# Patient Record
Sex: Male | Born: 1945
Health system: Southern US, Community
[De-identification: ages and names within clinical notes are randomized; demographics above are authoritative.]

## PROBLEM LIST (undated history)

## (undated) DIAGNOSIS — C801 Malignant (primary) neoplasm, unspecified: Secondary | ICD-10-CM

## (undated) DIAGNOSIS — I10 Essential (primary) hypertension: Secondary | ICD-10-CM

---

## 2000-01-09 ENCOUNTER — Other Ambulatory Visit: Admission: RE | Admit: 2000-01-09 | Discharge: 2000-01-09 | Payer: Self-pay | Admitting: Urology

## 2000-01-09 ENCOUNTER — Encounter (INDEPENDENT_AMBULATORY_CARE_PROVIDER_SITE_OTHER): Payer: Self-pay | Admitting: Specialist

## 2007-03-04 ENCOUNTER — Encounter: Admission: RE | Admit: 2007-03-04 | Discharge: 2007-03-04 | Payer: Self-pay | Admitting: Emergency Medicine

## 2007-04-25 ENCOUNTER — Ambulatory Visit: Payer: Self-pay | Admitting: Internal Medicine

## 2007-04-28 LAB — CBC WITH DIFFERENTIAL/PLATELET
BASO%: 0.5 % (ref 0.0–2.0)
Basophils Absolute: 0 10*3/uL (ref 0.0–0.1)
EOS%: 2.1 % (ref 0.0–7.0)
Eosinophils Absolute: 0.1 10*3/uL (ref 0.0–0.5)
HCT: 43.2 % (ref 38.7–49.9)
HGB: 15.1 g/dL (ref 13.0–17.1)
LYMPH%: 46.1 % (ref 14.0–48.0)
MCH: 32.7 pg (ref 28.0–33.4)
MCHC: 35 g/dL (ref 32.0–35.9)
MCV: 93.3 fL (ref 81.6–98.0)
MONO#: 0.4 10*3/uL (ref 0.1–0.9)
MONO%: 9.2 % (ref 0.0–13.0)
NEUT#: 2 10*3/uL (ref 1.5–6.5)
NEUT%: 42.1 % (ref 40.0–75.0)
Platelets: 298 10*3/uL (ref 145–400)
RBC: 4.63 10*6/uL (ref 4.20–5.71)
RDW: 13.1 % (ref 11.2–14.6)
WBC: 4.7 10*3/uL (ref 4.0–10.0)
lymph#: 2.2 10*3/uL (ref 0.9–3.3)

## 2007-04-28 LAB — COMPREHENSIVE METABOLIC PANEL
ALT: 13 U/L (ref 0–53)
AST: 15 U/L (ref 0–37)
Albumin: 4.2 g/dL (ref 3.5–5.2)
Alkaline Phosphatase: 76 U/L (ref 39–117)
BUN: 15 mg/dL (ref 6–23)
CO2: 24 mEq/L (ref 19–32)
Calcium: 9.6 mg/dL (ref 8.4–10.5)
Chloride: 105 mEq/L (ref 96–112)
Creatinine, Ser: 1.24 mg/dL (ref 0.40–1.50)
Glucose, Bld: 86 mg/dL (ref 70–99)
Potassium: 4.6 mEq/L (ref 3.5–5.3)
Sodium: 139 mEq/L (ref 135–145)
Total Bilirubin: 0.7 mg/dL (ref 0.3–1.2)
Total Protein: 7.2 g/dL (ref 6.0–8.3)

## 2007-05-03 ENCOUNTER — Ambulatory Visit (HOSPITAL_COMMUNITY): Admission: RE | Admit: 2007-05-03 | Discharge: 2007-05-03 | Payer: Self-pay | Admitting: Internal Medicine

## 2007-05-12 ENCOUNTER — Encounter (INDEPENDENT_AMBULATORY_CARE_PROVIDER_SITE_OTHER): Payer: Self-pay | Admitting: Orthopedic Surgery

## 2007-05-12 ENCOUNTER — Ambulatory Visit (HOSPITAL_COMMUNITY): Admission: RE | Admit: 2007-05-12 | Discharge: 2007-05-13 | Payer: Self-pay | Admitting: Orthopedic Surgery

## 2007-05-17 ENCOUNTER — Ambulatory Visit: Admission: RE | Admit: 2007-05-17 | Discharge: 2007-06-22 | Payer: Self-pay | Admitting: Radiation Oncology

## 2007-05-17 LAB — CBC WITH DIFFERENTIAL/PLATELET
BASO%: 1.1 % (ref 0.0–2.0)
Basophils Absolute: 0.1 10*3/uL (ref 0.0–0.1)
EOS%: 2.5 % (ref 0.0–7.0)
Eosinophils Absolute: 0.2 10*3/uL (ref 0.0–0.5)
HCT: 39 % (ref 38.7–49.9)
HGB: 14.2 g/dL (ref 13.0–17.1)
LYMPH%: 26.3 % (ref 14.0–48.0)
MCH: 32.6 pg (ref 28.0–33.4)
MCHC: 36.4 g/dL — ABNORMAL HIGH (ref 32.0–35.9)
MCV: 89.6 fL (ref 81.6–98.0)
MONO#: 0.6 10*3/uL (ref 0.1–0.9)
MONO%: 9.8 % (ref 0.0–13.0)
NEUT#: 4 10*3/uL (ref 1.5–6.5)
NEUT%: 60.4 % (ref 40.0–75.0)
Platelets: 305 10*3/uL (ref 145–400)
RBC: 4.36 10*6/uL (ref 4.20–5.71)
RDW: 10.4 % — ABNORMAL LOW (ref 11.2–14.6)
WBC: 6.5 10*3/uL (ref 4.0–10.0)
lymph#: 1.7 10*3/uL (ref 0.9–3.3)

## 2007-05-17 LAB — COMPREHENSIVE METABOLIC PANEL
ALT: 11 U/L (ref 0–53)
AST: 14 U/L (ref 0–37)
Albumin: 4 g/dL (ref 3.5–5.2)
Alkaline Phosphatase: 71 U/L (ref 39–117)
BUN: 20 mg/dL (ref 6–23)
CO2: 20 mEq/L (ref 19–32)
Calcium: 9.2 mg/dL (ref 8.4–10.5)
Chloride: 103 mEq/L (ref 96–112)
Creatinine, Ser: 1.32 mg/dL (ref 0.40–1.50)
Glucose, Bld: 82 mg/dL (ref 70–99)
Potassium: 4.5 mEq/L (ref 3.5–5.3)
Sodium: 138 mEq/L (ref 135–145)
Total Bilirubin: 0.9 mg/dL (ref 0.3–1.2)
Total Protein: 7.1 g/dL (ref 6.0–8.3)

## 2007-05-24 LAB — CBC WITH DIFFERENTIAL/PLATELET
BASO%: 0.9 % (ref 0.0–2.0)
Basophils Absolute: 0 10*3/uL (ref 0.0–0.1)
EOS%: 2.8 % (ref 0.0–7.0)
Eosinophils Absolute: 0.1 10*3/uL (ref 0.0–0.5)
HCT: 38.9 % (ref 38.7–49.9)
HGB: 14.1 g/dL (ref 13.0–17.1)
LYMPH%: 47.9 % (ref 14.0–48.0)
MCH: 31.8 pg (ref 28.0–33.4)
MCHC: 36.2 g/dL — ABNORMAL HIGH (ref 32.0–35.9)
MCV: 87.9 fL (ref 81.6–98.0)
MONO#: 0.2 10*3/uL (ref 0.1–0.9)
MONO%: 7.8 % (ref 0.0–13.0)
NEUT#: 1 10*3/uL — ABNORMAL LOW (ref 1.5–6.5)
NEUT%: 40.6 % (ref 40.0–75.0)
Platelets: 216 10*3/uL (ref 145–400)
RBC: 4.43 10*6/uL (ref 4.20–5.71)
RDW: 9.9 % — ABNORMAL LOW (ref 11.2–14.6)
WBC: 2.5 10*3/uL — ABNORMAL LOW (ref 4.0–10.0)
lymph#: 1.2 10*3/uL (ref 0.9–3.3)

## 2007-05-24 LAB — COMPREHENSIVE METABOLIC PANEL
ALT: 37 U/L (ref 0–53)
AST: 26 U/L (ref 0–37)
Albumin: 4.2 g/dL (ref 3.5–5.2)
Alkaline Phosphatase: 74 U/L (ref 39–117)
BUN: 19 mg/dL (ref 6–23)
CO2: 20 mEq/L (ref 19–32)
Calcium: 8 mg/dL — ABNORMAL LOW (ref 8.4–10.5)
Chloride: 100 mEq/L (ref 96–112)
Creatinine, Ser: 0.94 mg/dL (ref 0.40–1.50)
Glucose, Bld: 96 mg/dL (ref 70–99)
Potassium: 4.2 mEq/L (ref 3.5–5.3)
Sodium: 134 mEq/L — ABNORMAL LOW (ref 135–145)
Total Bilirubin: 0.7 mg/dL (ref 0.3–1.2)
Total Protein: 7.5 g/dL (ref 6.0–8.3)

## 2007-06-07 LAB — COMPREHENSIVE METABOLIC PANEL
ALT: 12 U/L (ref 0–53)
AST: 13 U/L (ref 0–37)
Albumin: 4 g/dL (ref 3.5–5.2)
Alkaline Phosphatase: 101 U/L (ref 39–117)
BUN: 13 mg/dL (ref 6–23)
CO2: 22 mEq/L (ref 19–32)
Calcium: 9.6 mg/dL (ref 8.4–10.5)
Chloride: 106 mEq/L (ref 96–112)
Creatinine, Ser: 0.99 mg/dL (ref 0.40–1.50)
Glucose, Bld: 93 mg/dL (ref 70–99)
Potassium: 4.7 mEq/L (ref 3.5–5.3)
Sodium: 139 mEq/L (ref 135–145)
Total Bilirubin: 0.4 mg/dL (ref 0.3–1.2)
Total Protein: 7.2 g/dL (ref 6.0–8.3)

## 2007-06-07 LAB — CBC WITH DIFFERENTIAL/PLATELET
BASO%: 0.5 % (ref 0.0–2.0)
Basophils Absolute: 0 10*3/uL (ref 0.0–0.1)
EOS%: 0.1 % (ref 0.0–7.0)
Eosinophils Absolute: 0 10*3/uL (ref 0.0–0.5)
HCT: 36.8 % — ABNORMAL LOW (ref 38.7–49.9)
HGB: 12.9 g/dL — ABNORMAL LOW (ref 13.0–17.1)
LYMPH%: 12.2 % — ABNORMAL LOW (ref 14.0–48.0)
MCH: 31.7 pg (ref 28.0–33.4)
MCHC: 35.1 g/dL (ref 32.0–35.9)
MCV: 90.3 fL (ref 81.6–98.0)
MONO#: 0.6 10*3/uL (ref 0.1–0.9)
MONO%: 6.7 % (ref 0.0–13.0)
NEUT#: 7.3 10*3/uL — ABNORMAL HIGH (ref 1.5–6.5)
NEUT%: 80.4 % — ABNORMAL HIGH (ref 40.0–75.0)
Platelets: 587 10*3/uL — ABNORMAL HIGH (ref 145–400)
RBC: 4.08 10*6/uL — ABNORMAL LOW (ref 4.20–5.71)
RDW: 11 % — ABNORMAL LOW (ref 11.2–14.6)
WBC: 9.1 10*3/uL (ref 4.0–10.0)
lymph#: 1.1 10*3/uL (ref 0.9–3.3)

## 2007-06-10 ENCOUNTER — Ambulatory Visit: Payer: Self-pay | Admitting: Internal Medicine

## 2007-06-14 LAB — BASIC METABOLIC PANEL
BUN: 18 mg/dL (ref 6–23)
CO2: 20 mEq/L (ref 19–32)
Calcium: 9.1 mg/dL (ref 8.4–10.5)
Chloride: 103 mEq/L (ref 96–112)
Creatinine, Ser: 1 mg/dL (ref 0.40–1.50)
Glucose, Bld: 111 mg/dL — ABNORMAL HIGH (ref 70–99)
Potassium: 4.3 mEq/L (ref 3.5–5.3)
Sodium: 139 mEq/L (ref 135–145)

## 2007-06-28 LAB — COMPREHENSIVE METABOLIC PANEL
ALT: 16 U/L (ref 0–53)
AST: 19 U/L (ref 0–37)
Albumin: 4 g/dL (ref 3.5–5.2)
Alkaline Phosphatase: 69 U/L (ref 39–117)
BUN: 22 mg/dL (ref 6–23)
CO2: 23 mEq/L (ref 19–32)
Calcium: 9.3 mg/dL (ref 8.4–10.5)
Chloride: 103 mEq/L (ref 96–112)
Creatinine, Ser: 1.06 mg/dL (ref 0.40–1.50)
Glucose, Bld: 84 mg/dL (ref 70–99)
Potassium: 4.3 mEq/L (ref 3.5–5.3)
Sodium: 138 mEq/L (ref 135–145)
Total Bilirubin: 0.6 mg/dL (ref 0.3–1.2)
Total Protein: 7.1 g/dL (ref 6.0–8.3)

## 2007-06-28 LAB — CBC WITH DIFFERENTIAL/PLATELET
BASO%: 0.9 % (ref 0.0–2.0)
Basophils Absolute: 0 10*3/uL (ref 0.0–0.1)
EOS%: 2.2 % (ref 0.0–7.0)
Eosinophils Absolute: 0 10*3/uL (ref 0.0–0.5)
HCT: 29.1 % — ABNORMAL LOW (ref 38.7–49.9)
HGB: 10.5 g/dL — ABNORMAL LOW (ref 13.0–17.1)
LYMPH%: 12.2 % — ABNORMAL LOW (ref 14.0–48.0)
MCH: 31.8 pg (ref 28.0–33.4)
MCHC: 36 g/dL — ABNORMAL HIGH (ref 32.0–35.9)
MCV: 88.4 fL (ref 81.6–98.0)
MONO#: 0.4 10*3/uL (ref 0.1–0.9)
MONO%: 17.2 % — ABNORMAL HIGH (ref 0.0–13.0)
NEUT#: 1.5 10*3/uL (ref 1.5–6.5)
NEUT%: 67.5 % (ref 40.0–75.0)
Platelets: 186 10*3/uL (ref 145–400)
RBC: 3.29 10*6/uL — ABNORMAL LOW (ref 4.20–5.71)
RDW: 12.1 % (ref 11.2–14.6)
WBC: 2.2 10*3/uL — ABNORMAL LOW (ref 4.0–10.0)
lymph#: 0.3 10*3/uL — ABNORMAL LOW (ref 0.9–3.3)

## 2007-07-05 ENCOUNTER — Ambulatory Visit (HOSPITAL_COMMUNITY): Admission: RE | Admit: 2007-07-05 | Discharge: 2007-07-05 | Payer: Self-pay | Admitting: Internal Medicine

## 2007-07-05 LAB — CBC WITH DIFFERENTIAL/PLATELET
BASO%: 1.1 % (ref 0.0–2.0)
Basophils Absolute: 0 10*3/uL (ref 0.0–0.1)
EOS%: 1.8 % (ref 0.0–7.0)
Eosinophils Absolute: 0 10*3/uL (ref 0.0–0.5)
HCT: UNDETERMINED % (ref 38.7–49.9)
HGB: 12.8 g/dL — ABNORMAL LOW (ref 13.0–17.1)
LYMPH%: 14.2 % (ref 14.0–48.0)
MCH: UNDETERMINED pg (ref 28.0–33.4)
MCHC: UNDETERMINED g/dL (ref 32.0–35.9)
MCV: UNDETERMINED fL (ref 81.6–98.0)
MONO#: 0.2 10*3/uL (ref 0.1–0.9)
MONO%: 10.7 % (ref 0.0–13.0)
NEUT#: 1.1 10*3/uL — ABNORMAL LOW (ref 1.5–6.5)
NEUT%: 72.3 % (ref 40.0–75.0)
Platelets: 406 10*3/uL — ABNORMAL HIGH (ref 145–400)
RBC: UNDETERMINED 10*6/uL (ref 4.20–5.71)
RDW: UNDETERMINED % (ref 11.2–14.6)
WBC: 1.5 10*3/uL — ABNORMAL LOW (ref 4.0–10.0)
lymph#: 0.2 10*3/uL — ABNORMAL LOW (ref 0.9–3.3)

## 2007-07-12 LAB — BASIC METABOLIC PANEL
BUN: 14 mg/dL (ref 6–23)
CO2: 24 mEq/L (ref 19–32)
Calcium: 8.4 mg/dL (ref 8.4–10.5)
Chloride: 108 mEq/L (ref 96–112)
Creatinine, Ser: 0.97 mg/dL (ref 0.40–1.50)
Glucose, Bld: 98 mg/dL (ref 70–99)
Potassium: 3.8 mEq/L (ref 3.5–5.3)
Sodium: 141 mEq/L (ref 135–145)

## 2007-07-19 LAB — COMPREHENSIVE METABOLIC PANEL
ALT: 13 U/L (ref 0–53)
AST: 16 U/L (ref 0–37)
Albumin: 3.7 g/dL (ref 3.5–5.2)
Alkaline Phosphatase: 64 U/L (ref 39–117)
BUN: 16 mg/dL (ref 6–23)
CO2: 22 mEq/L (ref 19–32)
Calcium: 8.3 mg/dL — ABNORMAL LOW (ref 8.4–10.5)
Chloride: 105 mEq/L (ref 96–112)
Creatinine, Ser: 0.98 mg/dL (ref 0.40–1.50)
Glucose, Bld: 93 mg/dL (ref 70–99)
Potassium: 4.2 mEq/L (ref 3.5–5.3)
Sodium: 139 mEq/L (ref 135–145)
Total Bilirubin: 0.4 mg/dL (ref 0.3–1.2)
Total Protein: 6.7 g/dL (ref 6.0–8.3)

## 2007-07-19 LAB — CBC WITH DIFFERENTIAL/PLATELET
BASO%: 1.1 % (ref 0.0–2.0)
Basophils Absolute: 0 10*3/uL (ref 0.0–0.1)
EOS%: 0.7 % (ref 0.0–7.0)
Eosinophils Absolute: 0 10*3/uL (ref 0.0–0.5)
HCT: 27.1 % — ABNORMAL LOW (ref 38.7–49.9)
HGB: 9.8 g/dL — ABNORMAL LOW (ref 13.0–17.1)
LYMPH%: 25.3 % (ref 14.0–48.0)
MCH: 33.6 pg — ABNORMAL HIGH (ref 28.0–33.4)
MCHC: 36.1 g/dL — ABNORMAL HIGH (ref 32.0–35.9)
MCV: 93.3 fL (ref 81.6–98.0)
MONO#: 0.7 10*3/uL (ref 0.1–0.9)
MONO%: 16.6 % — ABNORMAL HIGH (ref 0.0–13.0)
NEUT#: 2.3 10*3/uL (ref 1.5–6.5)
NEUT%: 56.3 % (ref 40.0–75.0)
Platelets: 249 10*3/uL (ref 145–400)
RBC: 2.91 10*6/uL — ABNORMAL LOW (ref 4.20–5.71)
RDW: 16.3 % — ABNORMAL HIGH (ref 11.2–14.6)
WBC: 4.1 10*3/uL (ref 4.0–10.0)
lymph#: 1 10*3/uL (ref 0.9–3.3)

## 2007-07-26 ENCOUNTER — Ambulatory Visit: Payer: Self-pay | Admitting: Internal Medicine

## 2007-07-26 LAB — CBC WITH DIFFERENTIAL/PLATELET
BASO%: 1.2 % (ref 0.0–2.0)
Basophils Absolute: 0 10*3/uL (ref 0.0–0.1)
EOS%: 0.8 % (ref 0.0–7.0)
Eosinophils Absolute: 0 10*3/uL (ref 0.0–0.5)
HCT: 26.2 % — ABNORMAL LOW (ref 38.7–49.9)
HGB: 9.2 g/dL — ABNORMAL LOW (ref 13.0–17.1)
LYMPH%: 27.3 % (ref 14.0–48.0)
MCH: 32.7 pg (ref 28.0–33.4)
MCHC: 35.1 g/dL (ref 32.0–35.9)
MCV: 93.1 fL (ref 81.6–98.0)
MONO#: 0.2 10*3/uL (ref 0.1–0.9)
MONO%: 7.8 % (ref 0.0–13.0)
NEUT#: 1.5 10*3/uL (ref 1.5–6.5)
NEUT%: 62.9 % (ref 40.0–75.0)
Platelets: 242 10*3/uL (ref 145–400)
RBC: 2.82 10*6/uL — ABNORMAL LOW (ref 4.20–5.71)
RDW: 14.7 % — ABNORMAL HIGH (ref 11.2–14.6)
WBC: 2.3 10*3/uL — ABNORMAL LOW (ref 4.0–10.0)
lymph#: 0.6 10*3/uL — ABNORMAL LOW (ref 0.9–3.3)

## 2007-08-02 ENCOUNTER — Encounter (HOSPITAL_COMMUNITY): Admission: RE | Admit: 2007-08-02 | Discharge: 2007-10-31 | Payer: Self-pay | Admitting: Internal Medicine

## 2007-08-02 LAB — CBC WITH DIFFERENTIAL/PLATELET
BASO%: 2.4 % — ABNORMAL HIGH (ref 0.0–2.0)
Basophils Absolute: 0 10*3/uL (ref 0.0–0.1)
EOS%: 0.5 % (ref 0.0–7.0)
Eosinophils Absolute: 0 10*3/uL (ref 0.0–0.5)
HCT: 21.6 % — ABNORMAL LOW (ref 38.7–49.9)
HGB: 7.7 g/dL — ABNORMAL LOW (ref 13.0–17.1)
LYMPH%: 35.8 % (ref 14.0–48.0)
MCH: 32.7 pg (ref 28.0–33.4)
MCHC: 35.7 g/dL (ref 32.0–35.9)
MCV: 91.8 fL (ref 81.6–98.0)
MONO#: 0.1 10*3/uL (ref 0.1–0.9)
MONO%: 6.8 % (ref 0.0–13.0)
NEUT#: 0.5 10*3/uL — ABNORMAL LOW (ref 1.5–6.5)
NEUT%: 54.4 % (ref 40.0–75.0)
Platelets: 55 10*3/uL — ABNORMAL LOW (ref 145–400)
RBC: 2.35 10*6/uL — ABNORMAL LOW (ref 4.20–5.71)
RDW: 14 % (ref 11.2–14.6)
WBC: 1 10*3/uL — ABNORMAL LOW (ref 4.0–10.0)
lymph#: 0.3 10*3/uL — ABNORMAL LOW (ref 0.9–3.3)

## 2007-08-02 LAB — TYPE & CROSSMATCH - CHCC

## 2007-08-09 LAB — CBC WITH DIFFERENTIAL/PLATELET
BASO%: 0.1 % (ref 0.0–2.0)
Basophils Absolute: 0 10*3/uL (ref 0.0–0.1)
EOS%: 0.6 % (ref 0.0–7.0)
Eosinophils Absolute: 0 10*3/uL (ref 0.0–0.5)
HCT: 32.3 % — ABNORMAL LOW (ref 38.7–49.9)
HGB: 11.3 g/dL — ABNORMAL LOW (ref 13.0–17.1)
LYMPH%: 27.4 % (ref 14.0–48.0)
MCH: 33.4 pg (ref 28.0–33.4)
MCHC: 35 g/dL (ref 32.0–35.9)
MCV: 95.5 fL (ref 81.6–98.0)
MONO#: 0.4 10*3/uL (ref 0.1–0.9)
MONO%: 17.7 % — ABNORMAL HIGH (ref 0.0–13.0)
NEUT#: 1.1 10*3/uL — ABNORMAL LOW (ref 1.5–6.5)
NEUT%: 54.2 % (ref 40.0–75.0)
Platelets: 256 10*3/uL (ref 145–400)
RBC: 3.38 10*6/uL — ABNORMAL LOW (ref 4.20–5.71)
RDW: 19.9 % — ABNORMAL HIGH (ref 11.2–14.6)
WBC: 2.1 10*3/uL — ABNORMAL LOW (ref 4.0–10.0)
lymph#: 0.6 10*3/uL — ABNORMAL LOW (ref 0.9–3.3)

## 2007-08-09 LAB — COMPREHENSIVE METABOLIC PANEL
ALT: 20 U/L (ref 0–53)
AST: 17 U/L (ref 0–37)
Albumin: 3.8 g/dL (ref 3.5–5.2)
Alkaline Phosphatase: 67 U/L (ref 39–117)
BUN: 14 mg/dL (ref 6–23)
CO2: 24 mEq/L (ref 19–32)
Calcium: 8.7 mg/dL (ref 8.4–10.5)
Chloride: 106 mEq/L (ref 96–112)
Creatinine, Ser: 0.98 mg/dL (ref 0.40–1.50)
Glucose, Bld: 103 mg/dL — ABNORMAL HIGH (ref 70–99)
Potassium: 4.1 mEq/L (ref 3.5–5.3)
Sodium: 140 mEq/L (ref 135–145)
Total Bilirubin: 0.6 mg/dL (ref 0.3–1.2)
Total Protein: 6.9 g/dL (ref 6.0–8.3)

## 2007-08-16 LAB — CBC WITH DIFFERENTIAL/PLATELET
BASO%: 1.6 % (ref 0.0–2.0)
Basophils Absolute: 0.1 10*3/uL (ref 0.0–0.1)
EOS%: 2.2 % (ref 0.0–7.0)
Eosinophils Absolute: 0.1 10*3/uL (ref 0.0–0.5)
HCT: 32.8 % — ABNORMAL LOW (ref 38.7–49.9)
HGB: 11.2 g/dL — ABNORMAL LOW (ref 13.0–17.1)
LYMPH%: 23.7 % (ref 14.0–48.0)
MCH: 34.9 pg — ABNORMAL HIGH (ref 28.0–33.4)
MCHC: 34.2 g/dL (ref 32.0–35.9)
MCV: 102 fL — ABNORMAL HIGH (ref 81.6–98.0)
MONO#: 0.9 10*3/uL (ref 0.1–0.9)
MONO%: 27.2 % — ABNORMAL HIGH (ref 0.0–13.0)
NEUT#: 1.5 10*3/uL (ref 1.5–6.5)
NEUT%: 45.4 % (ref 40.0–75.0)
Platelets: 484 10*3/uL — ABNORMAL HIGH (ref 145–400)
RBC: 3.22 10*6/uL — ABNORMAL LOW (ref 4.20–5.71)
RDW: 18.7 % — ABNORMAL HIGH (ref 11.2–14.6)
WBC: 3.2 10*3/uL — ABNORMAL LOW (ref 4.0–10.0)
lymph#: 0.8 10*3/uL — ABNORMAL LOW (ref 0.9–3.3)

## 2007-08-16 LAB — COMPREHENSIVE METABOLIC PANEL
ALT: 14 U/L (ref 0–53)
AST: 19 U/L (ref 0–37)
Albumin: 3.7 g/dL (ref 3.5–5.2)
Alkaline Phosphatase: 59 U/L (ref 39–117)
BUN: 15 mg/dL (ref 6–23)
CO2: 22 mEq/L (ref 19–32)
Calcium: 8.6 mg/dL (ref 8.4–10.5)
Chloride: 106 mEq/L (ref 96–112)
Creatinine, Ser: 1 mg/dL (ref 0.40–1.50)
Glucose, Bld: 89 mg/dL (ref 70–99)
Potassium: 4 mEq/L (ref 3.5–5.3)
Sodium: 139 mEq/L (ref 135–145)
Total Bilirubin: 0.5 mg/dL (ref 0.3–1.2)
Total Protein: 6.6 g/dL (ref 6.0–8.3)

## 2007-08-23 LAB — CBC WITH DIFFERENTIAL/PLATELET
BASO%: 0.5 % (ref 0.0–2.0)
Basophils Absolute: 0 10*3/uL (ref 0.0–0.1)
EOS%: 0.4 % (ref 0.0–7.0)
Eosinophils Absolute: 0 10*3/uL (ref 0.0–0.5)
HCT: 34.5 % — ABNORMAL LOW (ref 38.7–49.9)
HGB: 11.8 g/dL — ABNORMAL LOW (ref 13.0–17.1)
LYMPH%: 32.6 % (ref 14.0–48.0)
MCH: 33.1 pg (ref 28.0–33.4)
MCHC: 34.4 g/dL (ref 32.0–35.9)
MCV: 96.4 fL (ref 81.6–98.0)
MONO#: 0.2 10*3/uL (ref 0.1–0.9)
MONO%: 9.2 % (ref 0.0–13.0)
NEUT#: 1.3 10*3/uL — ABNORMAL LOW (ref 1.5–6.5)
NEUT%: 57.3 % (ref 40.0–75.0)
Platelets: 386 10*3/uL (ref 145–400)
RBC: 3.58 10*6/uL — ABNORMAL LOW (ref 4.20–5.71)
RDW: 18.5 % — ABNORMAL HIGH (ref 11.2–14.6)
WBC: 2.3 10*3/uL — ABNORMAL LOW (ref 4.0–10.0)
lymph#: 0.7 10*3/uL — ABNORMAL LOW (ref 0.9–3.3)

## 2007-08-24 LAB — HOLD TUBE, BLOOD BANK

## 2007-09-05 ENCOUNTER — Ambulatory Visit: Payer: Self-pay | Admitting: Internal Medicine

## 2007-09-06 LAB — CBC WITH DIFFERENTIAL/PLATELET
BASO%: 0.8 % (ref 0.0–2.0)
Basophils Absolute: 0.1 10*3/uL (ref 0.0–0.1)
EOS%: 0.5 % (ref 0.0–7.0)
Eosinophils Absolute: 0 10*3/uL (ref 0.0–0.5)
HCT: 35.1 % — ABNORMAL LOW (ref 38.7–49.9)
HGB: 11.6 g/dL — ABNORMAL LOW (ref 13.0–17.1)
LYMPH%: 7.1 % — ABNORMAL LOW (ref 14.0–48.0)
MCH: 32.9 pg (ref 28.0–33.4)
MCHC: 32.9 g/dL (ref 32.0–35.9)
MCV: 100 fL — ABNORMAL HIGH (ref 81.6–98.0)
MONO#: 1 10*3/uL — ABNORMAL HIGH (ref 0.1–0.9)
MONO%: 15 % — ABNORMAL HIGH (ref 0.0–13.0)
NEUT#: 4.9 10*3/uL (ref 1.5–6.5)
NEUT%: 76.6 % — ABNORMAL HIGH (ref 40.0–75.0)
Platelets: 417 10*3/uL — ABNORMAL HIGH (ref 145–400)
RBC: 3.52 10*6/uL — ABNORMAL LOW (ref 4.20–5.71)
RDW: 16.7 % — ABNORMAL HIGH (ref 11.2–14.6)
WBC: 6.4 10*3/uL (ref 4.0–10.0)
lymph#: 0.5 10*3/uL — ABNORMAL LOW (ref 0.9–3.3)

## 2007-09-06 LAB — COMPREHENSIVE METABOLIC PANEL
ALT: 20 U/L (ref 0–53)
AST: 24 U/L (ref 0–37)
Albumin: 3.7 g/dL (ref 3.5–5.2)
Alkaline Phosphatase: 62 U/L (ref 39–117)
BUN: 17 mg/dL (ref 6–23)
CO2: 24 mEq/L (ref 19–32)
Calcium: 8.9 mg/dL (ref 8.4–10.5)
Chloride: 103 mEq/L (ref 96–112)
Creatinine, Ser: 0.88 mg/dL (ref 0.40–1.50)
Glucose, Bld: 106 mg/dL — ABNORMAL HIGH (ref 70–99)
Potassium: 4.4 mEq/L (ref 3.5–5.3)
Sodium: 135 mEq/L (ref 135–145)
Total Bilirubin: 0.6 mg/dL (ref 0.3–1.2)
Total Protein: 6.6 g/dL (ref 6.0–8.3)

## 2007-09-07 ENCOUNTER — Ambulatory Visit (HOSPITAL_COMMUNITY): Admission: RE | Admit: 2007-09-07 | Discharge: 2007-09-07 | Payer: Self-pay | Admitting: Internal Medicine

## 2007-09-12 LAB — PROTIME-INR
INR: 1.1 — ABNORMAL LOW (ref 2.00–3.50)
Protime: 13.2 Seconds (ref 10.6–13.4)

## 2007-09-20 ENCOUNTER — Emergency Department (HOSPITAL_COMMUNITY): Admission: EM | Admit: 2007-09-20 | Discharge: 2007-09-20 | Payer: Self-pay | Admitting: Emergency Medicine

## 2007-09-23 ENCOUNTER — Ambulatory Visit (HOSPITAL_COMMUNITY): Admission: RE | Admit: 2007-09-23 | Discharge: 2007-09-23 | Payer: Self-pay | Admitting: Internal Medicine

## 2007-09-27 LAB — COMPREHENSIVE METABOLIC PANEL
ALT: 39 U/L (ref 0–53)
AST: 38 U/L — ABNORMAL HIGH (ref 0–37)
Albumin: 3.8 g/dL (ref 3.5–5.2)
Alkaline Phosphatase: 106 U/L (ref 39–117)
BUN: 21 mg/dL (ref 6–23)
CO2: 26 mEq/L (ref 19–32)
Calcium: 8.8 mg/dL (ref 8.4–10.5)
Chloride: 90 mEq/L — ABNORMAL LOW (ref 96–112)
Creatinine, Ser: 1.08 mg/dL (ref 0.40–1.50)
Glucose, Bld: 112 mg/dL — ABNORMAL HIGH (ref 70–99)
Potassium: 4.5 mEq/L (ref 3.5–5.3)
Sodium: 130 mEq/L — ABNORMAL LOW (ref 135–145)
Total Bilirubin: 0.5 mg/dL (ref 0.3–1.2)
Total Protein: 7.5 g/dL (ref 6.0–8.3)

## 2007-09-27 LAB — CBC WITH DIFFERENTIAL/PLATELET
BASO%: 0.1 % (ref 0.0–2.0)
Basophils Absolute: 0 10*3/uL (ref 0.0–0.1)
EOS%: 0.1 % (ref 0.0–7.0)
Eosinophils Absolute: 0 10*3/uL (ref 0.0–0.5)
HCT: 34.7 % — ABNORMAL LOW (ref 38.7–49.9)
HGB: 12 g/dL — ABNORMAL LOW (ref 13.0–17.1)
LYMPH%: 5.6 % — ABNORMAL LOW (ref 14.0–48.0)
MCH: 32.4 pg (ref 28.0–33.4)
MCHC: 34.6 g/dL (ref 32.0–35.9)
MCV: 93.8 fL (ref 81.6–98.0)
MONO#: 1.5 10*3/uL — ABNORMAL HIGH (ref 0.1–0.9)
MONO%: 16 % — ABNORMAL HIGH (ref 0.0–13.0)
NEUT#: 7.1 10*3/uL — ABNORMAL HIGH (ref 1.5–6.5)
NEUT%: 78.2 % — ABNORMAL HIGH (ref 40.0–75.0)
Platelets: 707 10*3/uL — ABNORMAL HIGH (ref 145–400)
RBC: 3.7 10*6/uL — ABNORMAL LOW (ref 4.20–5.71)
RDW: 17.6 % — ABNORMAL HIGH (ref 11.2–14.6)
WBC: 9.1 10*3/uL (ref 4.0–10.0)
lymph#: 0.5 10*3/uL — ABNORMAL LOW (ref 0.9–3.3)

## 2007-09-27 LAB — PROTIME-INR
INR: 2.7 (ref 2.00–3.50)
Protime: 32.4 Seconds — ABNORMAL HIGH (ref 10.6–13.4)

## 2007-10-04 LAB — PROTIME-INR

## 2007-10-04 LAB — BASIC METABOLIC PANEL
BUN: 11 mg/dL (ref 6–23)
CO2: 25 mEq/L (ref 19–32)
Calcium: 8.7 mg/dL (ref 8.4–10.5)
Chloride: 95 mEq/L — ABNORMAL LOW (ref 96–112)
Creatinine, Ser: 1.03 mg/dL (ref 0.40–1.50)
Glucose, Bld: 107 mg/dL — ABNORMAL HIGH (ref 70–99)
Potassium: 3.8 mEq/L (ref 3.5–5.3)
Sodium: 134 mEq/L — ABNORMAL LOW (ref 135–145)

## 2007-10-04 LAB — PROTHROMBIN TIME
INR: 5.5 (ref 0.0–1.5)
Prothrombin Time: 52 seconds — ABNORMAL HIGH (ref 11.6–15.2)

## 2007-10-07 LAB — PROTIME-INR
INR: 4.3 — ABNORMAL HIGH (ref 2.00–3.50)
Protime: 51.6 Seconds — ABNORMAL HIGH (ref 10.6–13.4)

## 2007-10-10 LAB — PROTIME-INR
INR: 2.4 (ref 2.00–3.50)
Protime: 28.8 Seconds — ABNORMAL HIGH (ref 10.6–13.4)

## 2007-10-13 LAB — PROTIME-INR
INR: 2.2 (ref 2.00–3.50)
Protime: 26.4 Seconds — ABNORMAL HIGH (ref 10.6–13.4)

## 2007-10-18 ENCOUNTER — Ambulatory Visit: Payer: Self-pay | Admitting: Internal Medicine

## 2007-10-20 LAB — PROTIME-INR
INR: 2 (ref 2.00–3.50)
Protime: 24 Seconds — ABNORMAL HIGH (ref 10.6–13.4)

## 2007-10-27 LAB — PROTIME-INR
INR: 1.7 — ABNORMAL LOW (ref 2.00–3.50)
Protime: 20.4 Seconds — ABNORMAL HIGH (ref 10.6–13.4)

## 2007-11-10 LAB — PROTIME-INR
INR: 1.8 — ABNORMAL LOW (ref 2.00–3.50)
Protime: 21.6 Seconds — ABNORMAL HIGH (ref 10.6–13.4)

## 2007-11-17 LAB — PROTIME-INR
INR: 1.6 — ABNORMAL LOW (ref 2.00–3.50)
Protime: 19.2 Seconds — ABNORMAL HIGH (ref 10.6–13.4)

## 2007-12-16 ENCOUNTER — Ambulatory Visit: Payer: Self-pay | Admitting: Internal Medicine

## 2007-12-20 ENCOUNTER — Ambulatory Visit (HOSPITAL_COMMUNITY): Admission: RE | Admit: 2007-12-20 | Discharge: 2007-12-20 | Payer: Self-pay | Admitting: Internal Medicine

## 2007-12-20 LAB — COMPREHENSIVE METABOLIC PANEL
ALT: 19 U/L (ref 0–53)
AST: 27 U/L (ref 0–37)
Albumin: 3.5 g/dL (ref 3.5–5.2)
Alkaline Phosphatase: 60 U/L (ref 39–117)
BUN: 18 mg/dL (ref 6–23)
CO2: 29 mEq/L (ref 19–32)
Calcium: 8.9 mg/dL (ref 8.4–10.5)
Chloride: 104 mEq/L (ref 96–112)
Creatinine, Ser: 1.18 mg/dL (ref 0.40–1.50)
Glucose, Bld: 96 mg/dL (ref 70–99)
Potassium: 3.9 mEq/L (ref 3.5–5.3)
Sodium: 139 mEq/L (ref 135–145)
Total Bilirubin: 0.8 mg/dL (ref 0.3–1.2)
Total Protein: 7 g/dL (ref 6.0–8.3)

## 2007-12-20 LAB — CBC WITH DIFFERENTIAL/PLATELET
BASO%: 0.5 % (ref 0.0–2.0)
Basophils Absolute: 0 10*3/uL (ref 0.0–0.1)
EOS%: 1.3 % (ref 0.0–7.0)
Eosinophils Absolute: 0.1 10*3/uL (ref 0.0–0.5)
HCT: 35.2 % — ABNORMAL LOW (ref 38.7–49.9)
HGB: 12 g/dL — ABNORMAL LOW (ref 13.0–17.1)
LYMPH%: 16.3 % (ref 14.0–48.0)
MCH: 32.4 pg (ref 28.0–33.4)
MCHC: 34 g/dL (ref 32.0–35.9)
MCV: 95.2 fL (ref 81.6–98.0)
MONO#: 0.4 10*3/uL (ref 0.1–0.9)
MONO%: 8.6 % (ref 0.0–13.0)
NEUT#: 3.2 10*3/uL (ref 1.5–6.5)
NEUT%: 73.3 % (ref 40.0–75.0)
Platelets: 320 10*3/uL (ref 145–400)
RBC: 3.69 10*6/uL — ABNORMAL LOW (ref 4.20–5.71)
RDW: 18.7 % — ABNORMAL HIGH (ref 11.2–14.6)
WBC: 4.4 10*3/uL (ref 4.0–10.0)
lymph#: 0.7 10*3/uL — ABNORMAL LOW (ref 0.9–3.3)

## 2007-12-20 LAB — PROTIME-INR
INR: 2 (ref 2.00–3.50)
Protime: 24 Seconds — ABNORMAL HIGH (ref 10.6–13.4)

## 2008-01-10 LAB — PROTIME-INR
INR: 1.7 — ABNORMAL LOW (ref 2.00–3.50)
Protime: 20.4 Seconds — ABNORMAL HIGH (ref 10.6–13.4)

## 2008-01-24 LAB — PROTIME-INR
INR: 2.3 (ref 2.00–3.50)
Protime: 27.6 Seconds — ABNORMAL HIGH (ref 10.6–13.4)

## 2008-02-03 ENCOUNTER — Ambulatory Visit: Payer: Self-pay | Admitting: Internal Medicine

## 2008-02-07 LAB — PROTIME-INR
INR: 2.3 (ref 2.00–3.50)
Protime: 27.6 Seconds — ABNORMAL HIGH (ref 10.6–13.4)

## 2008-02-21 LAB — PROTIME-INR
INR: 1.8 — ABNORMAL LOW (ref 2.00–3.50)
Protime: 21.6 Seconds — ABNORMAL HIGH (ref 10.6–13.4)

## 2008-03-22 ENCOUNTER — Ambulatory Visit: Payer: Self-pay | Admitting: Internal Medicine

## 2008-03-22 LAB — CBC WITH DIFFERENTIAL/PLATELET
BASO%: 0.2 % (ref 0.0–2.0)
Basophils Absolute: 0 10*3/uL (ref 0.0–0.1)
EOS%: 0.5 % (ref 0.0–7.0)
Eosinophils Absolute: 0 10*3/uL (ref 0.0–0.5)
HCT: 40.4 % (ref 38.7–49.9)
HGB: 13.9 g/dL (ref 13.0–17.1)
LYMPH%: 28.7 % (ref 14.0–48.0)
MCH: 33.1 pg (ref 28.0–33.4)
MCHC: 34.4 g/dL (ref 32.0–35.9)
MCV: 96.3 fL (ref 81.6–98.0)
MONO#: 0.3 10*3/uL (ref 0.1–0.9)
MONO%: 9.4 % (ref 0.0–13.0)
NEUT#: 2.3 10*3/uL (ref 1.5–6.5)
NEUT%: 61.2 % (ref 40.0–75.0)
Platelets: 282 10*3/uL (ref 145–400)
RBC: 4.19 10*6/uL — ABNORMAL LOW (ref 4.20–5.71)
RDW: 14.8 % — ABNORMAL HIGH (ref 11.2–14.6)
WBC: 3.7 10*3/uL — ABNORMAL LOW (ref 4.0–10.0)
lymph#: 1.1 10*3/uL (ref 0.9–3.3)

## 2008-03-22 LAB — COMPREHENSIVE METABOLIC PANEL
ALT: 18 U/L (ref 0–53)
AST: 29 U/L (ref 0–37)
Albumin: 3.7 g/dL (ref 3.5–5.2)
Alkaline Phosphatase: 52 U/L (ref 39–117)
BUN: 14 mg/dL (ref 6–23)
CO2: 24 mEq/L (ref 19–32)
Calcium: 8.8 mg/dL (ref 8.4–10.5)
Chloride: 106 mEq/L (ref 96–112)
Creatinine, Ser: 1.48 mg/dL (ref 0.40–1.50)
Glucose, Bld: 98 mg/dL (ref 70–99)
Potassium: 3.4 mEq/L — ABNORMAL LOW (ref 3.5–5.3)
Sodium: 138 mEq/L (ref 135–145)
Total Bilirubin: 0.8 mg/dL (ref 0.3–1.2)
Total Protein: 6.8 g/dL (ref 6.0–8.3)

## 2008-03-22 LAB — PROTIME-INR
INR: 2.2 (ref 2.00–3.50)
Protime: 26.4 Seconds — ABNORMAL HIGH (ref 10.6–13.4)

## 2008-03-23 ENCOUNTER — Ambulatory Visit (HOSPITAL_COMMUNITY): Admission: RE | Admit: 2008-03-23 | Discharge: 2008-03-23 | Payer: Self-pay | Admitting: Internal Medicine

## 2008-08-13 ENCOUNTER — Ambulatory Visit: Payer: Self-pay | Admitting: Internal Medicine

## 2008-11-07 ENCOUNTER — Ambulatory Visit: Payer: Self-pay | Admitting: Internal Medicine

## 2008-11-20 LAB — COMPREHENSIVE METABOLIC PANEL
ALT: 12 U/L (ref 0–53)
AST: 18 U/L (ref 0–37)
Albumin: 4.1 g/dL (ref 3.5–5.2)
Alkaline Phosphatase: 51 U/L (ref 39–117)
BUN: 17 mg/dL (ref 6–23)
CO2: 23 mEq/L (ref 19–32)
Calcium: 9.1 mg/dL (ref 8.4–10.5)
Chloride: 105 mEq/L (ref 96–112)
Creatinine, Ser: 1.29 mg/dL (ref 0.40–1.50)
Glucose, Bld: 85 mg/dL (ref 70–99)
Potassium: 3.9 mEq/L (ref 3.5–5.3)
Sodium: 137 mEq/L (ref 135–145)
Total Bilirubin: 0.6 mg/dL (ref 0.3–1.2)
Total Protein: 6.9 g/dL (ref 6.0–8.3)

## 2008-11-20 LAB — CBC WITH DIFFERENTIAL/PLATELET
BASO%: 0.3 % (ref 0.0–2.0)
Basophils Absolute: 0 10*3/uL (ref 0.0–0.1)
EOS%: 0.3 % (ref 0.0–7.0)
Eosinophils Absolute: 0 10*3/uL (ref 0.0–0.5)
HCT: 39.3 % (ref 38.4–49.9)
HGB: 13.6 g/dL (ref 13.0–17.1)
LYMPH%: 22.8 % (ref 14.0–49.0)
MCH: 35 pg — ABNORMAL HIGH (ref 27.2–33.4)
MCHC: 34.6 g/dL (ref 32.0–36.0)
MCV: 101.1 fL — ABNORMAL HIGH (ref 79.3–98.0)
MONO#: 0.2 10*3/uL (ref 0.1–0.9)
MONO%: 6.2 % (ref 0.0–14.0)
NEUT#: 2.7 10*3/uL (ref 1.5–6.5)
NEUT%: 70.4 % (ref 39.0–75.0)
Platelets: 221 10*3/uL (ref 140–400)
RBC: 3.89 10*6/uL — ABNORMAL LOW (ref 4.20–5.82)
RDW: 14 % (ref 11.0–14.6)
WBC: 3.8 10*3/uL — ABNORMAL LOW (ref 4.0–10.3)
lymph#: 0.9 10*3/uL (ref 0.9–3.3)

## 2009-06-03 ENCOUNTER — Ambulatory Visit: Payer: Self-pay | Admitting: Internal Medicine

## 2009-06-05 ENCOUNTER — Ambulatory Visit (HOSPITAL_COMMUNITY): Admission: RE | Admit: 2009-06-05 | Discharge: 2009-06-05 | Payer: Self-pay | Admitting: Internal Medicine

## 2009-06-05 LAB — CBC WITH DIFFERENTIAL/PLATELET
BASO%: 0.4 % (ref 0.0–2.0)
Basophils Absolute: 0 10*3/uL (ref 0.0–0.1)
EOS%: 1.3 % (ref 0.0–7.0)
Eosinophils Absolute: 0.1 10*3/uL (ref 0.0–0.5)
HCT: 43.2 % (ref 38.4–49.9)
HGB: 14.7 g/dL (ref 13.0–17.1)
LYMPH%: 29.9 % (ref 14.0–49.0)
MCH: 34.4 pg — ABNORMAL HIGH (ref 27.2–33.4)
MCHC: 34 g/dL (ref 32.0–36.0)
MCV: 101.2 fL — ABNORMAL HIGH (ref 79.3–98.0)
MONO#: 0.4 10*3/uL (ref 0.1–0.9)
MONO%: 8.3 % (ref 0.0–14.0)
NEUT#: 3 10*3/uL (ref 1.5–6.5)
NEUT%: 60.1 % (ref 39.0–75.0)
Platelets: 225 10*3/uL (ref 140–400)
RBC: 4.26 10*6/uL (ref 4.20–5.82)
RDW: 14.2 % (ref 11.0–14.6)
WBC: 5 10*3/uL (ref 4.0–10.3)
lymph#: 1.5 10*3/uL (ref 0.9–3.3)

## 2009-06-05 LAB — COMPREHENSIVE METABOLIC PANEL
ALT: 17 U/L (ref 0–53)
AST: 22 U/L (ref 0–37)
Albumin: 4.1 g/dL (ref 3.5–5.2)
Alkaline Phosphatase: 56 U/L (ref 39–117)
BUN: 18 mg/dL (ref 6–23)
CO2: 28 mEq/L (ref 19–32)
Calcium: 9.3 mg/dL (ref 8.4–10.5)
Chloride: 101 mEq/L (ref 96–112)
Creatinine, Ser: 1.44 mg/dL (ref 0.40–1.50)
Glucose, Bld: 98 mg/dL (ref 70–99)
Potassium: 4.3 mEq/L (ref 3.5–5.3)
Sodium: 136 mEq/L (ref 135–145)
Total Bilirubin: 0.8 mg/dL (ref 0.3–1.2)
Total Protein: 7.4 g/dL (ref 6.0–8.3)

## 2009-08-04 IMAGING — CT CT ABDOMEN W/ CM
2 of 5 series · 15 of 46 positions shown, 17 images · IV contrast (agent unspecified)
Comparison: 07/05/2007

CLINICAL DATA: Lung cancer, ongoing chemotherapy

CT CHEST WITH CONTRAST,CT ABDOMEN WITH CONTRAST,CT PELVIS WITH
CONTRAST
CT ABDOMEN WITH CONTRAST
CT PELVIS WITH CONTRAST
TECHNIQUE: Multidetector CT imaging of the chest was performed
following the standard protocol during bolus administration of
intravenous contrast.,Technique:  Multidetector CT imaging of the
abdomen was performed following the standard protocol during b
Contrast: 100 ml 1mnipaque-D88 IV

[Series 2: cap 5.0 b40f st · axial · 0.88mm/px · z∈[-662,-67]mm · 12 of 135 slices shown, 14 images]
[im 8/135  soft-tissue]
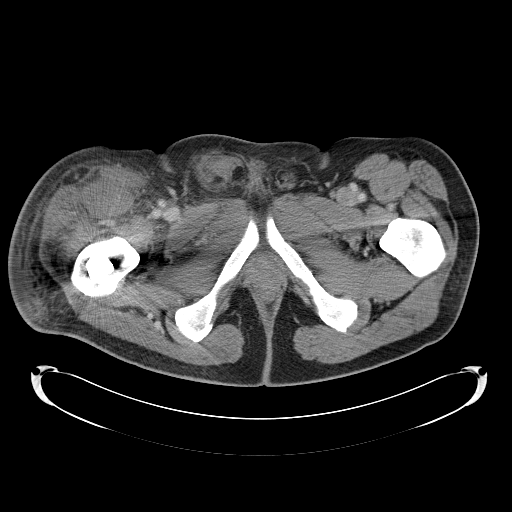
[im 8/135  bone]
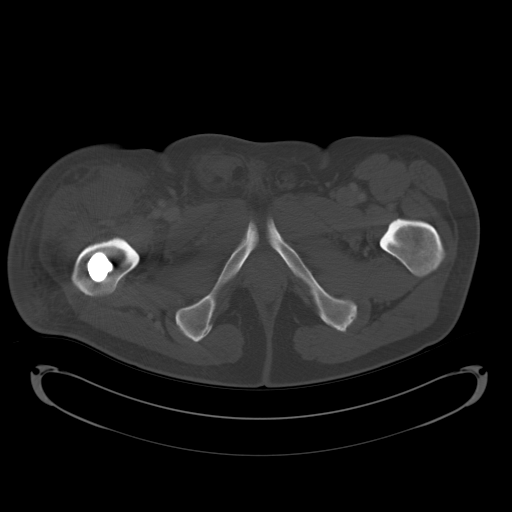
[im 23/135  soft-tissue]
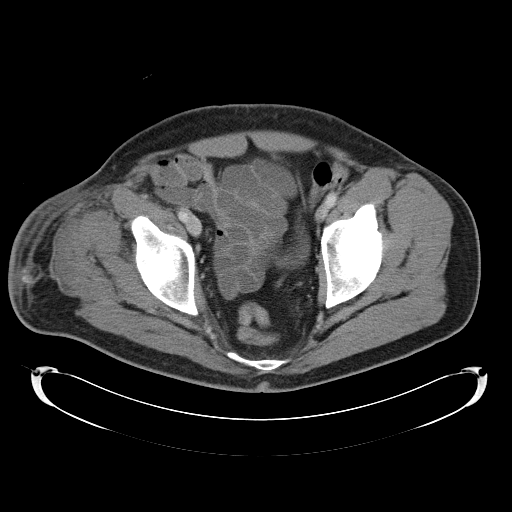
[im 30/135  soft-tissue]
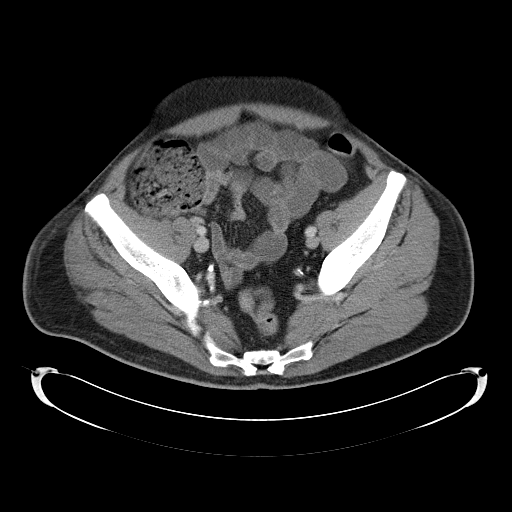
[im 38/135  soft-tissue]
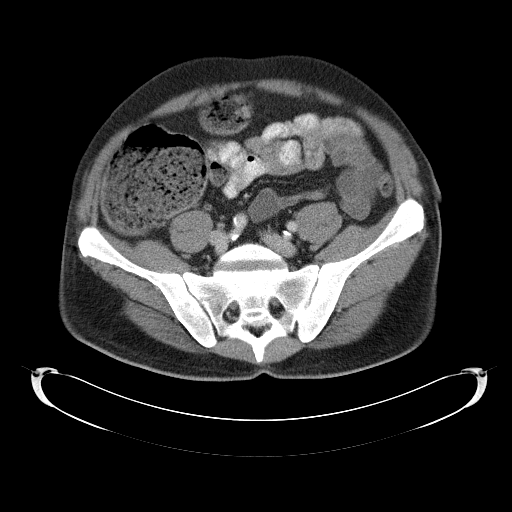
[im 53/135  soft-tissue]
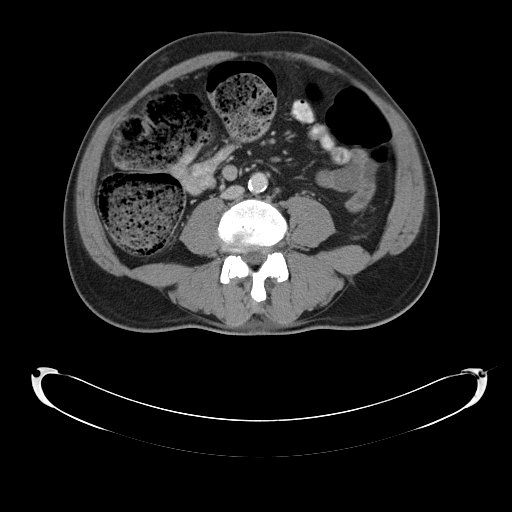
[im 60/135  soft-tissue]
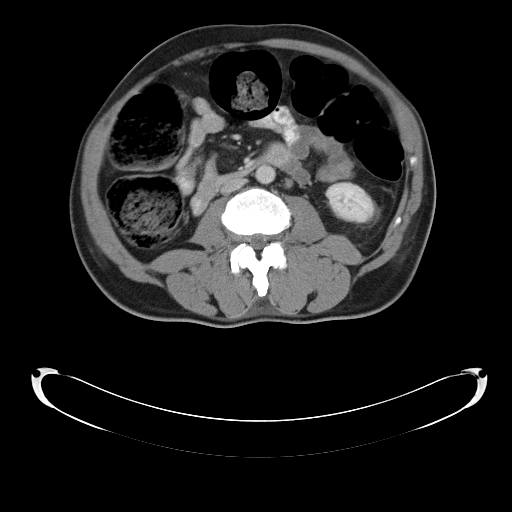
[im 75/135  soft-tissue]
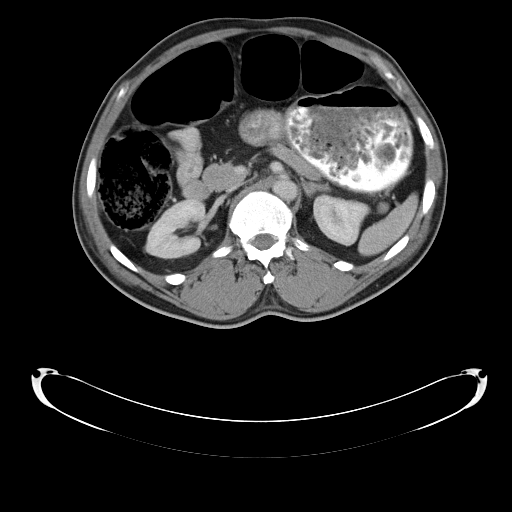
[im 82/135  soft-tissue]
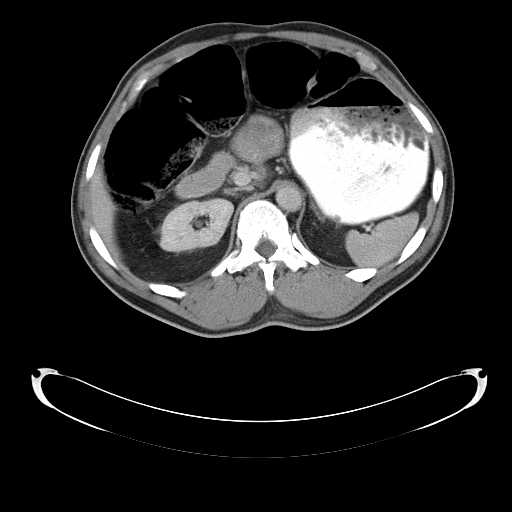
[im 97/135  soft-tissue]
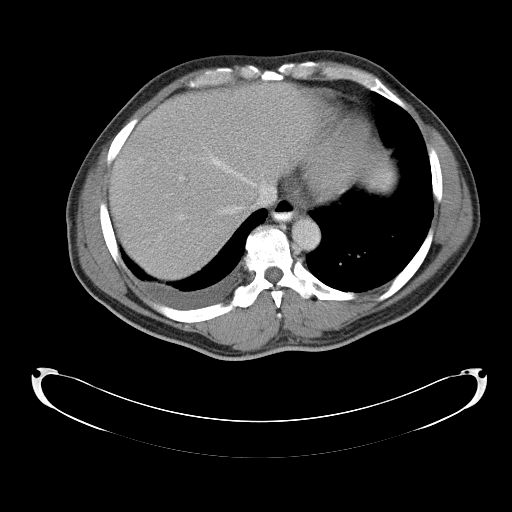
[im 97/135  bone]
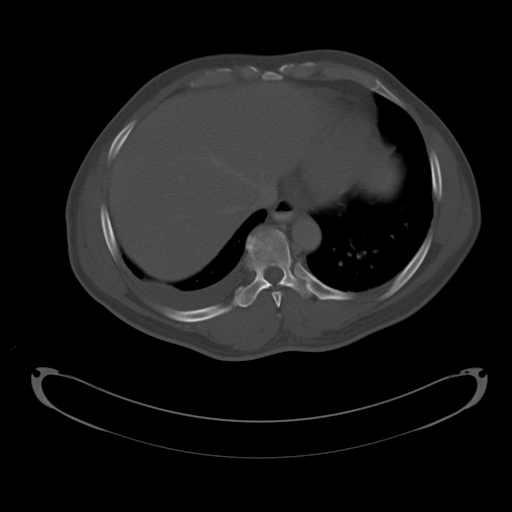
[im 105/135  soft-tissue]
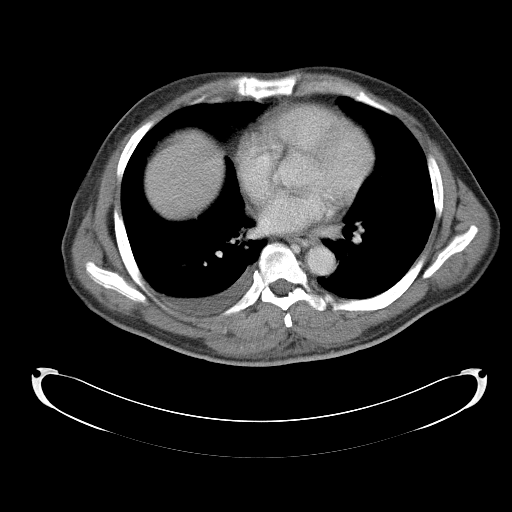
[im 112/135  soft-tissue]
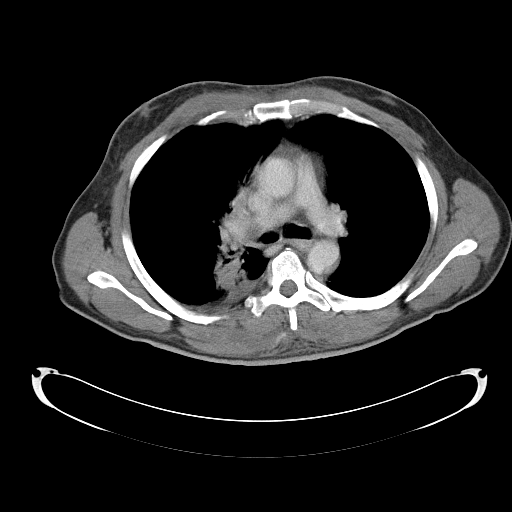
[im 127/135  soft-tissue]
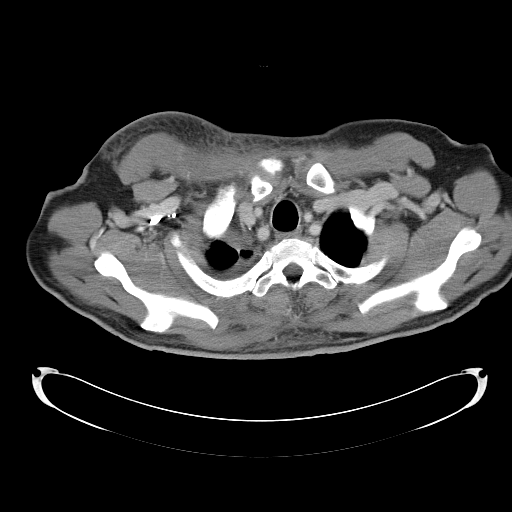

[Series 602: coronal images · coronal · 1.35mm/px · 3 of 93 slices shown]
[im 31/93  soft-tissue]
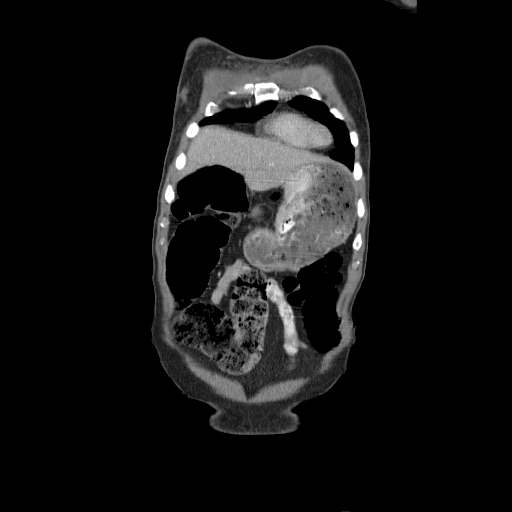
[im 41/93  soft-tissue]
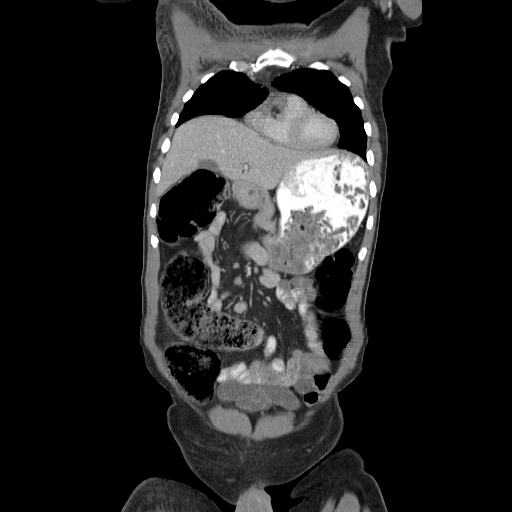
[im 52/93  soft-tissue]
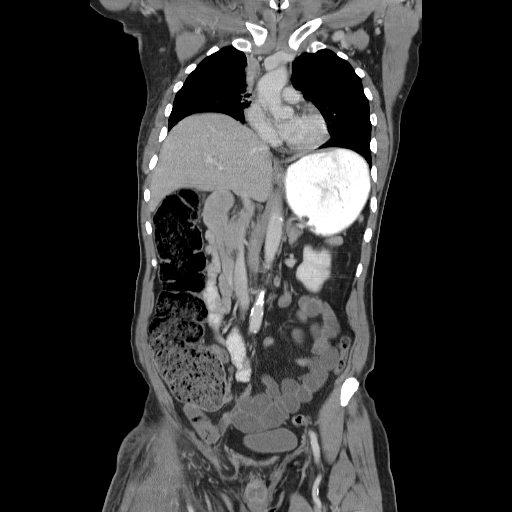

[15 of 46 positions shown; findings below may reference images not displayed]

FINDINGS: Right suprahilar mass again noted with complete collapse
of the right upper lobe.  It is very difficult to measure the right
suprahilar mass given the adjacent collapsed lung, best estimate is
3.8 x 2.4 cm, compared 3.1 x 2.4 cm previously.  Visually, this
does not appear significantly changed, and again the slight
difference may be simply related to inability to define well the
borders of the mass.

There is a new small right pleural effusions.  Rounded opacity
noted in the posterior right lower lobe with air bronchograms.
Mild haziness noted in the paramediastinal lungs bilaterally,
possibly related to radiation.  Otherwise left lung clear.

No enlarging lymph nodes.  Heart is normal size.  Small pericardial
effusion, new since prior study.  There is stranding in the
subcutaneous soft tissues of the anterior chest wall, predominately
overlying the sternum and right pectus muscles.  If this is within
the radiation field, this can be related to radiation.  Otherwise,
this may represent cellulitis.  Recommend clinical correlation.

No acute bony abnormality.
IMPRESSION: Right suprahilar mass visually appears stable with complete
collapse of the adjacent right upper lobe.  Very difficult to
measure due to surrounding collapsed lung.

There is new rounded airspace disease in the posterior medial right
lower lobe.  Air bronchograms noted.  I favor this represents an
infectious or inflammatory process.  Recommend follow up to assure
resolution.

New small right pleural effusion and pericardial effusion.

Subcutaneous anterior chest wall edema, possibly related to
radiation or cellulitis.  Recommend clinical correlation.

CT ABDOMEN
FINDINGS: Colon is dilated, with a large amount of stool
particularly in the cecum and ascending colon.  Stomach dilated
with debris/food material.  No focal lesion in the liver, spleen,
pancreas, adrenals, or kidneys.  Small bowel grossly unremarkable.
No free fluid, free air, or adenopathy.  Atherosclerotic
calcifications in the aorta without aneurysm.

Sclerotic focus again seen within the L1 vertebral body, stable
IMPRESSION: Stable sclerotic focus in the left side of the L1 vertebral body.

Large amount stool throughout the colon which is distended with
stool in the cecum and ascending colon.

No acute findings or new evidence of metastases.

CT PELVIS
FINDINGS: Right inguinal hernia present, containing fat,
unchanged.  A large amount stool noted in the cecum.  The rectum
and sigmoid colon are decompressed.  Pelvic small bowel grossly
unremarkable.  No free fluid, free air, or adenopathy.  Prostate is
enlarged.

Postoperative changes noted in the region of the right hip.  There
is a sclerotic lesion in the right iliac bone adjacent to the right
SI joint, stable.
IMPRESSION: Stable sclerotic lesion in the right iliac bone.

A large amount stool in the cecum.

## 2009-11-26 ENCOUNTER — Ambulatory Visit: Payer: Self-pay | Admitting: Internal Medicine

## 2009-11-28 ENCOUNTER — Ambulatory Visit (HOSPITAL_COMMUNITY): Admission: RE | Admit: 2009-11-28 | Discharge: 2009-11-28 | Payer: Self-pay | Admitting: Internal Medicine

## 2009-11-28 LAB — COMPREHENSIVE METABOLIC PANEL
ALT: 17 U/L (ref 0–53)
AST: 23 U/L (ref 0–37)
Albumin: 3.8 g/dL (ref 3.5–5.2)
Alkaline Phosphatase: 55 U/L (ref 39–117)
BUN: 11 mg/dL (ref 6–23)
CO2: 29 mEq/L (ref 19–32)
Calcium: 9.3 mg/dL (ref 8.4–10.5)
Chloride: 104 mEq/L (ref 96–112)
Creatinine, Ser: 1.3 mg/dL (ref 0.40–1.50)
Glucose, Bld: 104 mg/dL — ABNORMAL HIGH (ref 70–99)
Potassium: 4.8 mEq/L (ref 3.5–5.3)
Sodium: 138 mEq/L (ref 135–145)
Total Bilirubin: 0.8 mg/dL (ref 0.3–1.2)
Total Protein: 6.9 g/dL (ref 6.0–8.3)

## 2009-11-28 LAB — CBC WITH DIFFERENTIAL/PLATELET
BASO%: 0.2 % (ref 0.0–2.0)
Basophils Absolute: 0 10*3/uL (ref 0.0–0.1)
EOS%: 1.3 % (ref 0.0–7.0)
Eosinophils Absolute: 0.1 10*3/uL (ref 0.0–0.5)
HCT: 41 % (ref 38.4–49.9)
HGB: 14.1 g/dL (ref 13.0–17.1)
LYMPH%: 30.9 % (ref 14.0–49.0)
MCH: 33.5 pg — ABNORMAL HIGH (ref 27.2–33.4)
MCHC: 34.3 g/dL (ref 32.0–36.0)
MCV: 97.6 fL (ref 79.3–98.0)
MONO#: 0.4 10*3/uL (ref 0.1–0.9)
MONO%: 11 % (ref 0.0–14.0)
NEUT#: 2.2 10*3/uL (ref 1.5–6.5)
NEUT%: 56.6 % (ref 39.0–75.0)
Platelets: 222 10*3/uL (ref 140–400)
RBC: 4.21 10*6/uL (ref 4.20–5.82)
RDW: 13.6 % (ref 11.0–14.6)
WBC: 3.9 10*3/uL — ABNORMAL LOW (ref 4.0–10.3)
lymph#: 1.2 10*3/uL (ref 0.9–3.3)

## 2010-05-29 ENCOUNTER — Ambulatory Visit: Payer: Self-pay | Admitting: Internal Medicine

## 2010-06-02 ENCOUNTER — Ambulatory Visit (HOSPITAL_COMMUNITY)
Admission: RE | Admit: 2010-06-02 | Discharge: 2010-06-02 | Payer: Self-pay | Source: Home / Self Care | Attending: Internal Medicine | Admitting: Internal Medicine

## 2010-06-02 LAB — CBC WITH DIFFERENTIAL/PLATELET
BASO%: 0.2 % (ref 0.0–2.0)
Basophils Absolute: 0 10*3/uL (ref 0.0–0.1)
EOS%: 0.9 % (ref 0.0–7.0)
Eosinophils Absolute: 0 10*3/uL (ref 0.0–0.5)
HCT: 43.7 % (ref 38.4–49.9)
HGB: 14.9 g/dL (ref 13.0–17.1)
LYMPH%: 27.6 % (ref 14.0–49.0)
MCH: 33 pg (ref 27.2–33.4)
MCHC: 34.1 g/dL (ref 32.0–36.0)
MCV: 96.9 fL (ref 79.3–98.0)
MONO#: 0.4 10*3/uL (ref 0.1–0.9)
MONO%: 8.4 % (ref 0.0–14.0)
NEUT#: 2.8 10*3/uL (ref 1.5–6.5)
NEUT%: 62.9 % (ref 39.0–75.0)
Platelets: 244 10*3/uL (ref 140–400)
RBC: 4.51 10*6/uL (ref 4.20–5.82)
RDW: 14 % (ref 11.0–14.6)
WBC: 4.5 10*3/uL (ref 4.0–10.3)
lymph#: 1.2 10*3/uL (ref 0.9–3.3)

## 2010-06-02 LAB — COMPREHENSIVE METABOLIC PANEL
ALT: 19 U/L (ref 0–53)
AST: 24 U/L (ref 0–37)
Albumin: 4 g/dL (ref 3.5–5.2)
Alkaline Phosphatase: 60 U/L (ref 39–117)
BUN: 15 mg/dL (ref 6–23)
CO2: 25 mEq/L (ref 19–32)
Calcium: 9.3 mg/dL (ref 8.4–10.5)
Chloride: 105 mEq/L (ref 96–112)
Creatinine, Ser: 1.39 mg/dL (ref 0.40–1.50)
Glucose, Bld: 103 mg/dL — ABNORMAL HIGH (ref 70–99)
Potassium: 4.1 mEq/L (ref 3.5–5.3)
Sodium: 139 mEq/L (ref 135–145)
Total Bilirubin: 1 mg/dL (ref 0.3–1.2)
Total Protein: 7.6 g/dL (ref 6.0–8.3)

## 2010-07-11 ENCOUNTER — Other Ambulatory Visit: Payer: Self-pay | Admitting: Internal Medicine

## 2010-07-11 DIAGNOSIS — C3490 Malignant neoplasm of unspecified part of unspecified bronchus or lung: Secondary | ICD-10-CM

## 2010-07-13 ENCOUNTER — Encounter: Payer: Self-pay | Admitting: Internal Medicine

## 2010-07-14 ENCOUNTER — Encounter: Payer: Self-pay | Admitting: Emergency Medicine

## 2010-09-08 LAB — POCT I-STAT, CHEM 8
BUN: 16 mg/dL (ref 6–23)
Calcium, Ion: 1.15 mmol/L (ref 1.12–1.32)
Chloride: 104 mEq/L (ref 96–112)
Creatinine, Ser: 1.2 mg/dL (ref 0.4–1.5)
Glucose, Bld: 102 mg/dL — ABNORMAL HIGH (ref 70–99)
HCT: 43 % (ref 39.0–52.0)
Hemoglobin: 14.6 g/dL (ref 13.0–17.0)
Potassium: 5 mEq/L (ref 3.5–5.1)
Sodium: 137 mEq/L (ref 135–145)
TCO2: 26 mmol/L (ref 0–100)

## 2010-11-04 NOTE — Op Note (Signed)
NAME:  Tommy Johnson, Tommy Johnson NO.:  [id number]   MEDICAL RECORD NO.:  [id number]          PATIENT TYPE:  INP   LOCATION:  5007                         FACILITY:  [facility]   PHYSICIAN:  Nadara Mustard, MD     DATE OF BIRTH:  1945-09-19   DATE OF PROCEDURE:  05/12/2007  DATE OF DISCHARGE:                               OPERATIVE REPORT   PREOPERATIVE DIAGNOSIS:  Pending pathologic right femoral neck fracture.   POSTOPERATIVE DIAGNOSIS:  Pending pathologic right femoral neck  fracture.   PROCEDURE:  Intramedullary nail fixation of the right femur with a Recon  nail, 420 mm long, 11 mm in diameter Synthes with a 105 mm spiral blade  proximally.   SURGEON:  Nadara Mustard, M.D.   ANESTHESIA:  General.   ESTIMATED BLOOD LOSS:  Minimal.   ANTIBIOTICS:  1 gram of Kefzol.   DRAINS:  None.   COMPLICATIONS:  None.   SPECIMENS:  Bone specimen sent to pathology.   DISPOSITION:  To the PACU in stable condition.   INDICATIONS FOR PROCEDURE:  The patient is a 65 year old gentleman with  metastatic CA with a CT scan and a PET scan that showed a large  metastatic lesion in the femoral neck.  Due to the size and location of  the lesion, the patient is at risk for pathologic fracture and presents  at this time for surgical intervention.  The risks and benefits were  discussed including infection, neurovascular injury, persistent pain,  and need for additional surgery.  The patient states he understands and  wished to proceed at this time.   DESCRIPTION OF PROCEDURE:  The patient was brought to OR room 5 and  underwent a general anesthetic.  After an adequate level of anesthesia  was obtained, the patient was placed supine on the [facility] fracture  table.  The right lower extremity was placed in the boot traction.  The  left lower extremity was placed in the dorsal lithotomy position and the  hip was prepped using DuraPrep and draped into a sterile field with the  shower curtain.   A skin incision was made just proximal to the greater  trochanter.  A guidewire was inserted into the greater trochanter.  C-  arm fluoroscopy verified reduction.  The 17.5 drill was used.  The  metastatic bone was extremely sclerotic and this was difficult to drill  through.  C-arm fluoroscopy again verified the alignment of the  drilling.  The guidewire was then inserted. This measured 420 mm in  length and this was then sequentially reamed to 13 mm for the 11 mm  nail.  The nail was inserted and using the external alignment jig, the  guidewire was inset and was inserted center/center in the AP radiographs  and in the lateral radiographs, this was just inferior to the middle of  the femoral head.  This was then measured to be a 105 and this was  overdrilled due to the sclerotic bone and the spiral blade was placed.  The spiral blade was locked. The alignment jig was removed.  C-arm  fluoroscopy  verified reduction in both AP and lateral planes.  The  wounds were irrigated with normal saline.  The subcu was closed using 2-  0 Vicryl and the skin was closed using approximate staples, and then a  Mepilex dressing was applied.  The patient was extubated and taken to  the PACU in stable condition.      Nadara Mustard, MD  Electronically Signed     MVD/MEDQ  D:  05/12/2007  T:  05/12/2007  Job:  [id number]

## 2010-12-01 ENCOUNTER — Other Ambulatory Visit: Payer: Self-pay | Admitting: Internal Medicine

## 2010-12-01 ENCOUNTER — Ambulatory Visit (HOSPITAL_COMMUNITY)
Admission: RE | Admit: 2010-12-01 | Discharge: 2010-12-01 | Disposition: A | Discharge status: Home / Self Care | Payer: Medicare Other | Source: Ambulatory Visit | Attending: Internal Medicine | Admitting: Internal Medicine

## 2010-12-01 ENCOUNTER — Encounter (HOSPITAL_BASED_OUTPATIENT_CLINIC_OR_DEPARTMENT_OTHER): Payer: Medicare Other | Admitting: Internal Medicine

## 2010-12-01 ENCOUNTER — Encounter (HOSPITAL_COMMUNITY): Payer: Self-pay

## 2010-12-01 DIAGNOSIS — M47814 Spondylosis without myelopathy or radiculopathy, thoracic region: Secondary | ICD-10-CM | POA: Insufficient documentation

## 2010-12-01 DIAGNOSIS — C3490 Malignant neoplasm of unspecified part of unspecified bronchus or lung: Secondary | ICD-10-CM | POA: Insufficient documentation

## 2010-12-01 DIAGNOSIS — N200 Calculus of kidney: Secondary | ICD-10-CM | POA: Insufficient documentation

## 2010-12-01 DIAGNOSIS — N400 Enlarged prostate without lower urinary tract symptoms: Secondary | ICD-10-CM | POA: Insufficient documentation

## 2010-12-01 DIAGNOSIS — K4020 Bilateral inguinal hernia, without obstruction or gangrene, not specified as recurrent: Secondary | ICD-10-CM | POA: Insufficient documentation

## 2010-12-01 HISTORY — DX: Malignant (primary) neoplasm, unspecified: C80.1

## 2010-12-01 LAB — CMP (CANCER CENTER ONLY)
ALT(SGPT): 22 U/L (ref 10–47)
AST: 27 U/L (ref 11–38)
Albumin: 3.4 g/dL (ref 3.3–5.5)
Alkaline Phosphatase: 74 U/L (ref 26–84)
BUN, Bld: 17 mg/dL (ref 7–22)
CO2: 26 mEq/L (ref 18–33)
Calcium: 8.9 mg/dL (ref 8.0–10.3)
Chloride: 100 mEq/L (ref 98–108)
Creat: 1.3 mg/dl — ABNORMAL HIGH (ref 0.6–1.2)
Glucose, Bld: 99 mg/dL (ref 73–118)
Potassium: 4.5 mEq/L (ref 3.3–4.7)
Sodium: 141 mEq/L (ref 128–145)
Total Bilirubin: 0.7 mg/dl (ref 0.20–1.60)
Total Protein: 7.1 g/dL (ref 6.4–8.1)

## 2010-12-01 LAB — CBC WITH DIFFERENTIAL/PLATELET
BASO%: 0.3 % (ref 0.0–2.0)
Basophils Absolute: 0 10*3/uL (ref 0.0–0.1)
EOS%: 1.7 % (ref 0.0–7.0)
Eosinophils Absolute: 0.1 10*3/uL (ref 0.0–0.5)
HCT: 42.3 % (ref 38.4–49.9)
HGB: 14.4 g/dL (ref 13.0–17.1)
LYMPH%: 31.6 % (ref 14.0–49.0)
MCH: 32.8 pg (ref 27.2–33.4)
MCHC: 34.1 g/dL (ref 32.0–36.0)
MCV: 96.1 fL (ref 79.3–98.0)
MONO#: 0.4 10*3/uL (ref 0.1–0.9)
MONO%: 9.7 % (ref 0.0–14.0)
NEUT#: 2.4 10*3/uL (ref 1.5–6.5)
NEUT%: 56.7 % (ref 39.0–75.0)
Platelets: 233 10*3/uL (ref 140–400)
RBC: 4.4 10*6/uL (ref 4.20–5.82)
RDW: 13.7 % (ref 11.0–14.6)
WBC: 4.3 10*3/uL (ref 4.0–10.3)
lymph#: 1.4 10*3/uL (ref 0.9–3.3)

## 2010-12-01 MED ORDER — IOHEXOL 300 MG/ML  SOLN
100.0000 mL | Freq: Once | INTRAMUSCULAR | Status: AC | PRN
  Administered 2010-12-01: 100 mL via INTRAVENOUS

## 2010-12-04 ENCOUNTER — Encounter (HOSPITAL_BASED_OUTPATIENT_CLINIC_OR_DEPARTMENT_OTHER): Payer: Medicare Other | Admitting: Internal Medicine

## 2010-12-04 ENCOUNTER — Other Ambulatory Visit: Payer: Self-pay | Admitting: Internal Medicine

## 2010-12-04 DIAGNOSIS — Z85118 Personal history of other malignant neoplasm of bronchus and lung: Secondary | ICD-10-CM

## 2010-12-04 DIAGNOSIS — C3490 Malignant neoplasm of unspecified part of unspecified bronchus or lung: Secondary | ICD-10-CM

## 2011-03-13 LAB — CROSSMATCH
ABO/RH(D): B POS
Antibody Screen: NEGATIVE

## 2011-03-13 LAB — ABO/RH: ABO/RH(D): B POS

## 2011-03-31 LAB — CBC
HCT: 44.7
Hemoglobin: 15.3
MCHC: 34.1
MCV: 94.1
Platelets: 309
RBC: 4.76
RDW: 13.3
WBC: 5

## 2011-03-31 LAB — BASIC METABOLIC PANEL
BUN: 14
CO2: 28
Calcium: 9.3
Chloride: 101
Creatinine, Ser: 1.19
GFR calc Af Amer: >60
GFR calc non Af Amer: >60
Glucose, Bld: 99
Potassium: 4.4
Sodium: 137

## 2011-05-28 ENCOUNTER — Other Ambulatory Visit: Payer: Medicare Other | Admitting: Lab

## 2011-05-28 ENCOUNTER — Inpatient Hospital Stay (HOSPITAL_COMMUNITY): Admission: RE | Admit: 2011-05-28 | Payer: Medicare Other | Source: Ambulatory Visit

## 2011-06-02 ENCOUNTER — Ambulatory Visit: Payer: Medicare Other | Admitting: Internal Medicine

## 2011-06-02 ENCOUNTER — Telehealth: Payer: Self-pay | Admitting: Internal Medicine

## 2011-06-02 ENCOUNTER — Encounter: Payer: Self-pay | Admitting: Internal Medicine

## 2011-06-02 NOTE — Telephone Encounter (Signed)
Called pt, cell number disconnected, called home number, left message regarding CT on 12/28 lab draw first then CT. Pt will see MD on 12/31.Also informed pt to get oral contrast before CT

## 2011-06-02 NOTE — Telephone Encounter (Signed)
Returned pt call who was returning [place] call form yesterday. Pt did not get CT scan on 05/28/11 so I told him not to come in for appt today. I sent Rose a request to r/s ct and f/u and call pt.

## 2011-06-19 ENCOUNTER — Other Ambulatory Visit (HOSPITAL_BASED_OUTPATIENT_CLINIC_OR_DEPARTMENT_OTHER): Payer: Medicare Other | Admitting: Lab

## 2011-06-19 ENCOUNTER — Ambulatory Visit (HOSPITAL_COMMUNITY)
Admission: RE | Admit: 2011-06-19 | Discharge: 2011-06-19 | Disposition: A | Discharge status: Home / Self Care | Payer: Medicare Other | Source: Ambulatory Visit | Attending: Internal Medicine | Admitting: Internal Medicine

## 2011-06-19 ENCOUNTER — Other Ambulatory Visit: Payer: Self-pay | Admitting: Internal Medicine

## 2011-06-19 DIAGNOSIS — K4020 Bilateral inguinal hernia, without obstruction or gangrene, not specified as recurrent: Secondary | ICD-10-CM | POA: Insufficient documentation

## 2011-06-19 DIAGNOSIS — M47817 Spondylosis without myelopathy or radiculopathy, lumbosacral region: Secondary | ICD-10-CM | POA: Insufficient documentation

## 2011-06-19 DIAGNOSIS — C3490 Malignant neoplasm of unspecified part of unspecified bronchus or lung: Secondary | ICD-10-CM

## 2011-06-19 DIAGNOSIS — I700 Atherosclerosis of aorta: Secondary | ICD-10-CM | POA: Insufficient documentation

## 2011-06-19 DIAGNOSIS — M47814 Spondylosis without myelopathy or radiculopathy, thoracic region: Secondary | ICD-10-CM | POA: Insufficient documentation

## 2011-06-19 DIAGNOSIS — I2510 Atherosclerotic heart disease of native coronary artery without angina pectoris: Secondary | ICD-10-CM | POA: Insufficient documentation

## 2011-06-19 DIAGNOSIS — M948X9 Other specified disorders of cartilage, unspecified sites: Secondary | ICD-10-CM | POA: Insufficient documentation

## 2011-06-19 DIAGNOSIS — N400 Enlarged prostate without lower urinary tract symptoms: Secondary | ICD-10-CM | POA: Insufficient documentation

## 2011-06-19 LAB — CMP (CANCER CENTER ONLY)
ALT(SGPT): 21 U/L (ref 10–47)
AST: 21 U/L (ref 11–38)
Albumin: 3.8 g/dL (ref 3.3–5.5)
Alkaline Phosphatase: 67 U/L (ref 26–84)
BUN, Bld: 17 mg/dL (ref 7–22)
CO2: 30 mEq/L (ref 18–33)
Calcium: 9.6 mg/dL (ref 8.0–10.3)
Chloride: 98 mEq/L (ref 98–108)
Creat: 1.2 mg/dl (ref 0.6–1.2)
Glucose, Bld: 100 mg/dL (ref 73–118)
Potassium: 4.3 mEq/L (ref 3.3–4.7)
Sodium: 145 mEq/L (ref 128–145)
Total Bilirubin: 1.2 mg/dl (ref 0.20–1.60)
Total Protein: 8.2 g/dL — ABNORMAL HIGH (ref 6.4–8.1)

## 2011-06-19 LAB — CBC WITH DIFFERENTIAL/PLATELET
BASO%: 0.3 % (ref 0.0–2.0)
Basophils Absolute: 0 10*3/uL (ref 0.0–0.1)
EOS%: 1.7 % (ref 0.0–7.0)
Eosinophils Absolute: 0.1 10*3/uL (ref 0.0–0.5)
HCT: 46.6 % (ref 38.4–49.9)
HGB: 16 g/dL (ref 13.0–17.1)
LYMPH%: 37.8 % (ref 14.0–49.0)
MCH: 33.1 pg (ref 27.2–33.4)
MCHC: 34.3 g/dL (ref 32.0–36.0)
MCV: 96.4 fL (ref 79.3–98.0)
MONO#: 0.5 10*3/uL (ref 0.1–0.9)
MONO%: 9.6 % (ref 0.0–14.0)
NEUT#: 2.5 10*3/uL (ref 1.5–6.5)
NEUT%: 50.6 % (ref 39.0–75.0)
Platelets: 241 10*3/uL (ref 140–400)
RBC: 4.84 10*6/uL (ref 4.20–5.82)
RDW: 13.6 % (ref 11.0–14.6)
WBC: 5 10*3/uL (ref 4.0–10.3)
lymph#: 1.9 10*3/uL (ref 0.9–3.3)

## 2011-06-19 MED ORDER — IOHEXOL 300 MG/ML  SOLN
100.0000 mL | Freq: Once | INTRAMUSCULAR | Status: AC | PRN
  Administered 2011-06-19: 100 mL via INTRAVENOUS

## 2011-06-22 ENCOUNTER — Ambulatory Visit (HOSPITAL_BASED_OUTPATIENT_CLINIC_OR_DEPARTMENT_OTHER): Payer: Medicare Other | Admitting: Internal Medicine

## 2011-06-22 VITALS — BP 153/88 | HR 94 | Temp 97.1°F | Wt 215.9 lb

## 2011-06-22 DIAGNOSIS — Z85118 Personal history of other malignant neoplasm of bronchus and lung: Secondary | ICD-10-CM

## 2011-06-22 DIAGNOSIS — C3490 Malignant neoplasm of unspecified part of unspecified bronchus or lung: Secondary | ICD-10-CM

## 2011-06-22 DIAGNOSIS — Z9221 Personal history of antineoplastic chemotherapy: Secondary | ICD-10-CM

## 2011-06-22 NOTE — Progress Notes (Signed)
[facility] OFFICE PROGRESS NOTE  DIAGNOSIS: Metastatic non-small cell lung cancer, squamous cell carcinoma diagnosed in September 2008.  PRIOR THERAPY:  1. Status post right femoral fixation for impending fracture performed on May 12, 2007. 2. Status post palliative radiotherapy to the chest and right hip area under the care of Dr. Kathrynn Running between May 31, 2007, through June 20, 2007. 3. Status post 6 cycles of systemic chemotherapy with carboplatin and gemcitabine.  Last dose was given September 13, 2007. 4. Status post 6 months' treatment with Coumadin for deep venous thrombosis of the right lower extremity that was diagnosed in March 2009.  CURRENT THERAPY: Observation.  INTERVAL HISTORY: Tommy Johnson 65 y.o. male returns to the clinic today for six-month followup visit. The patient has no complaints today. He denied having any significant chest pain or shortness of breath., no cough or hemoptysis. He has no weight loss or night sweats. He has repeat CT scan of the chest, abdomen and pelvis performed recently and he is here today for evaluation and discussion of his scan results.  MEDICAL HISTORY: Past Medical History  Diagnosis Date  . Cancer     lung ca    ALLERGIES:   has no known allergies.  MEDICATIONS:  Current Outpatient Prescriptions  Medication Sig Dispense Refill  . b complex vitamins tablet Take 1 tablet by mouth daily.        . enalapril (VASOTEC) 20 MG tablet Take 20 mg by mouth daily.        . fish oil-omega-3 fatty acids 1000 MG capsule Take 2 g by mouth daily.        Marland Kitchen levothyroxine (SYNTHROID, LEVOTHROID) 75 MCG tablet Take 75 mcg by mouth daily.        . Multiple Vitamin (MULTIVITAMIN) tablet Take 1 tablet by mouth daily.          REVIEW OF SYSTEMS:  A comprehensive review of systems was negative.   PHYSICAL EXAMINATION: General appearance: alert, cooperative and no distress Head: Normocephalic, without obvious abnormality,  atraumatic Neck: no adenopathy Lymph nodes: Cervical, supraclavicular, and axillary nodes normal. Resp: clear to auscultation bilaterally Cardio: regular rate and rhythm, S1, S2 normal, no murmur, click, rub or gallop GI: soft, non-tender; bowel sounds normal; no masses,  no organomegaly Extremities: extremities normal, atraumatic, no cyanosis or edema Neurologic: Alert and oriented X 3, normal strength and tone. Normal symmetric reflexes. Normal coordination and gait  ECOG PERFORMANCE STATUS: 0 - Asymptomatic  Blood pressure 153/88, pulse 94, temperature 97.1 F (36.2 C), temperature source Oral, weight 215 lb 14.4 oz (97.932 kg).  LABORATORY DATA: Lab Results  Component Value Date   WBC 5.0 06/19/2011   HGB 16.0 06/19/2011   HCT 46.6 06/19/2011   MCV 96.4 06/19/2011   PLT 241 06/19/2011      Chemistry      Component Value Date/Time   NA 145 06/19/2011 0839   NA 139 06/02/2010 0857   K 4.3 06/19/2011 0839   K 4.1 06/02/2010 0857   CL 98 06/19/2011 0839   CL 105 06/02/2010 0857   CO2 30 06/19/2011 0839   CO2 25 06/02/2010 0857   BUN 17 06/19/2011 0839   BUN 15 06/02/2010 0857   CREATININE 1.2 06/19/2011 0839   CREATININE 1.39 06/02/2010 0857      Component Value Date/Time   CALCIUM 9.6 06/19/2011 0839   CALCIUM 9.3 06/02/2010 0857   ALKPHOS 67 06/19/2011 0839   ALKPHOS 60 06/02/2010 0857  AST 21 06/19/2011 0839   AST 24 06/02/2010 0857   ALT 19 06/02/2010 0857   BILITOT 1.20 06/19/2011 0839   BILITOT 1.0 06/02/2010 0857       RADIOGRAPHIC STUDIES: Ct Head W Wo Contrast  06/19/2011  *RADIOLOGY REPORT*  Clinical Data: 65 year old male with lung cancer.  Staging.  CT HEAD WITHOUT AND WITH CONTRAST  Technique:  Contiguous axial images were obtained from the base of the skull through the vertex without and with intravenous contrast.  Contrast: OMNIPAQUE IOHEXOL 300 MG/ML IV SOLN In conjunction with CT(s) of the chest abdomen and pelvis which is(are) reported  separately.  Comparison: Brain MRI with and without contrast 05/03/2007.  Findings: Visualized paranasal sinuses and mastoids are clear.  No osseous lesion of the calvarium.  No acute scalp or orbit soft tissue findings.  Mild Calcified atherosclerosis at the skull base.  Stable cerebral volume.  Stable mild lateral ventricle asymmetry. No ventriculomegaly. No acute intracranial hemorrhage identified. Patchy cerebral white matter hypodensity, more so on the right, appears stable since 2008. No evidence of cortically based acute infarction identified.  No abnormal enhancement identified.  No midline shift, mass effect, or evidence of mass lesion.  Major intracranial vascular structures are enhancing.  Dominant distal left vertebral artery.  IMPRESSION: 1.  No acute or metastatic intracranial abnormality.  Stable nonspecific white matter changes, favor small vessel disease related. 2.  Chest abdomen and pelvis reported separately.  Original Report Authenticated By: Harley Hallmark, M.D.   Ct Chest W Contrast  06/19/2011  *RADIOLOGY REPORT*  Clinical Data:  Lung cancer diagnosed 02/2007, chemotherapy and XRT complete.  CT CHEST, ABDOMEN AND PELVIS WITH CONTRAST  Technique:  Multidetector CT imaging of the chest, abdomen and pelvis was performed following the standard protocol during bolus administration of intravenous contrast.  Contrast: OMNIPAQUE IOHEXOL 300 MG/ML IV SOLN  Comparison:  12/01/2010  CT CHEST  Findings:  Stable right paramediastinal radiation changes.  No suspicious pulmonary nodules. No pleural effusion or pneumothorax.  The heart is normal in size.  No pericardial effusion.  Coronary atherosclerosis.  Atherosclerotic calcifications of the aortic arch.  No suspicious mediastinal, hilar, or axillary lymphadenopathy.  Mild degenerative changes of the thoracic spine.  IMPRESSION: Stable right paramediastinal radiation changes.  No evidence of recurrent/metastatic disease in the chest.  CT ABDOMEN  AND PELVIS  Findings:  Liver, spleen, pancreas, and adrenal glands are within normal limits.  Gallbladder is unremarkable.  No intrahepatic or extrahepatic ductal dilatation.  Kidneys are notable for bilateral cortical atrophy.  No hydronephrosis.  No evidence of bowel obstruction.  Normal appendix.  Atherosclerotic calcifications of the abdominal aorta and branch vessels.  No abdominopelvic ascites.  No suspicious abdominopelvic lymphadenopathy.  Prostatomegaly, measuring 6.4 cm in transverse dimension.  Bladder is within normal limits.  Bilateral fat containing inguinal hernias with stable inflammatory changes / stranding on the right.  Mild degenerative changes of the lumbar spine.  Stable sclerotic lesions in the left L1 vertebral body and right iliac bone.  Status post ORIF of the right hip.  IMPRESSION: Stable sclerotic lesions in the left L1 vertebral body and right iliac bone.  No evidence of new or progressive metastatic disease in the abdomen/pelvis.  Original Report Authenticated By: Charline Bills, M.D.     ASSESSMENT: This is a very pleasant 65 years old American male was metastatic non-small cell lung cancer diagnosed more than 4 years ago. The patient is status post systemic chemotherapy and has been observation  since March of 2009. He is doing fine and he has no evidence for disease recurrence. I discussed the scan results with the patient   PLAN: I recommended for him continuous observation with repeat CT scan of the chest, abdomen and pelvis in 6 months and the patient would come back for followup visit at that time  All questions were answered. The patient knows to call the clinic with any problems, questions or concerns. We can certainly see the patient much sooner if necessary.

## 2011-12-18 ENCOUNTER — Encounter (HOSPITAL_COMMUNITY): Payer: Self-pay

## 2011-12-18 ENCOUNTER — Ambulatory Visit (HOSPITAL_COMMUNITY)
Admission: RE | Admit: 2011-12-18 | Discharge: 2011-12-18 | Disposition: A | Discharge status: Home / Self Care | Payer: Medicare Other | Source: Ambulatory Visit | Attending: Internal Medicine | Admitting: Internal Medicine

## 2011-12-18 ENCOUNTER — Other Ambulatory Visit (HOSPITAL_BASED_OUTPATIENT_CLINIC_OR_DEPARTMENT_OTHER): Payer: Medicare Other | Admitting: Lab

## 2011-12-18 DIAGNOSIS — C3490 Malignant neoplasm of unspecified part of unspecified bronchus or lung: Secondary | ICD-10-CM | POA: Insufficient documentation

## 2011-12-18 DIAGNOSIS — C801 Malignant (primary) neoplasm, unspecified: Secondary | ICD-10-CM

## 2011-12-18 DIAGNOSIS — C7951 Secondary malignant neoplasm of bone: Secondary | ICD-10-CM

## 2011-12-18 DIAGNOSIS — K4090 Unilateral inguinal hernia, without obstruction or gangrene, not specified as recurrent: Secondary | ICD-10-CM | POA: Insufficient documentation

## 2011-12-18 HISTORY — DX: Essential (primary) hypertension: I10

## 2011-12-18 LAB — CBC WITH DIFFERENTIAL/PLATELET
BASO%: 0.8 % (ref 0.0–2.0)
Basophils Absolute: 0 10*3/uL (ref 0.0–0.1)
EOS%: 1.8 % (ref 0.0–7.0)
Eosinophils Absolute: 0.1 10*3/uL (ref 0.0–0.5)
HCT: 44 % (ref 38.4–49.9)
HGB: 14.8 g/dL (ref 13.0–17.1)
LYMPH%: 37.4 % (ref 14.0–49.0)
MCH: 32.9 pg (ref 27.2–33.4)
MCHC: 33.6 g/dL (ref 32.0–36.0)
MCV: 97.8 fL (ref 79.3–98.0)
MONO#: 0.3 10*3/uL (ref 0.1–0.9)
MONO%: 8 % (ref 0.0–14.0)
NEUT#: 2.2 10*3/uL (ref 1.5–6.5)
NEUT%: 52 % (ref 39.0–75.0)
Platelets: 245 10*3/uL (ref 140–400)
RBC: 4.5 10*6/uL (ref 4.20–5.82)
RDW: 14.9 % — ABNORMAL HIGH (ref 11.0–14.6)
WBC: 4.3 10*3/uL (ref 4.0–10.3)
lymph#: 1.6 10*3/uL (ref 0.9–3.3)

## 2011-12-18 LAB — CMP (CANCER CENTER ONLY)
ALT(SGPT): 27 U/L (ref 10–47)
AST: 30 U/L (ref 11–38)
Albumin: 3.8 g/dL (ref 3.3–5.5)
Alkaline Phosphatase: 70 U/L (ref 26–84)
BUN, Bld: 15 mg/dL (ref 7–22)
CO2: 28 mEq/L (ref 18–33)
Calcium: 9 mg/dL (ref 8.0–10.3)
Chloride: 99 mEq/L (ref 98–108)
Creat: 1.2 mg/dl (ref 0.6–1.2)
Glucose, Bld: 108 mg/dL (ref 73–118)
Potassium: 4.9 mEq/L — ABNORMAL HIGH (ref 3.3–4.7)
Sodium: 140 mEq/L (ref 128–145)
Total Bilirubin: 0.8 mg/dl (ref 0.20–1.60)
Total Protein: 7.6 g/dL (ref 6.4–8.1)

## 2011-12-18 MED ORDER — IOHEXOL 300 MG/ML  SOLN
100.0000 mL | Freq: Once | INTRAMUSCULAR | Status: AC | PRN
  Administered 2011-12-18: 100 mL via INTRAVENOUS

## 2011-12-21 ENCOUNTER — Encounter: Payer: Self-pay | Admitting: *Deleted

## 2011-12-21 ENCOUNTER — Ambulatory Visit (HOSPITAL_BASED_OUTPATIENT_CLINIC_OR_DEPARTMENT_OTHER): Payer: Medicare Other | Admitting: Internal Medicine

## 2011-12-21 ENCOUNTER — Ambulatory Visit: Payer: Medicare Other | Admitting: Internal Medicine

## 2011-12-21 ENCOUNTER — Telehealth: Payer: Self-pay | Admitting: Internal Medicine

## 2011-12-21 VITALS — BP 146/84 | HR 79 | Temp 97.9°F | Ht 71.0 in | Wt 214.5 lb

## 2011-12-21 DIAGNOSIS — C3490 Malignant neoplasm of unspecified part of unspecified bronchus or lung: Secondary | ICD-10-CM

## 2011-12-21 DIAGNOSIS — C3411 Malignant neoplasm of upper lobe, right bronchus or lung: Secondary | ICD-10-CM | POA: Insufficient documentation

## 2011-12-21 NOTE — Progress Notes (Signed)
[facility] [phone]   Fax:([phone]  OFFICE PROGRESS NOTE  DIAGNOSIS: Metastatic non-small cell lung cancer, squamous cell carcinoma diagnosed in September 2008.   PRIOR THERAPY:  1. Status post right femoral fixation for impending fracture performed on May 12, 2007. 2. Status post palliative radiotherapy to the chest and right hip area under the care of Dr. Kathrynn Running between May 31, 2007, through June 20, 2007. 3. Status post 6 cycles of systemic chemotherapy with carboplatin and gemcitabine. Last dose was given September 13, 2007. 4. Status post 6 months' treatment with Coumadin for deep venous thrombosis of the right lower extremity that was diagnosed in March 2009.  CURRENT THERAPY: Observation.   INTERVAL HISTORY: Tommy Johnson 66 y.o. male returns to the clinic today for routine six-month followup visit accompanied by his wife. The patient has no complaints today. He denied having any significant weight loss or night sweats. He has no chest pain or shortness breath, no cough or hemoptysis. He has repeat CT scan of the chest, abdomen and pelvis performed recently and he is here today for evaluation and discussion of his scan results.  MEDICAL HISTORY: Past Medical History  Diagnosis Date  . lung ca dx'd 02/2007  . Hypertension     ALLERGIES:   has no known allergies.  MEDICATIONS:  Current Outpatient Prescriptions  Medication Sig Dispense Refill  . b complex vitamins tablet Take 1 tablet by mouth daily.        . enalapril (VASOTEC) 20 MG tablet Take 20 mg by mouth daily.        . fish oil-omega-3 fatty acids 1000 MG capsule Take 2 g by mouth daily.        Marland Kitchen levothyroxine (SYNTHROID, LEVOTHROID) 75 MCG tablet Take 75 mcg by mouth daily.        . Multiple Vitamin (MULTIVITAMIN) tablet Take 1 tablet by mouth daily.          REVIEW OF SYSTEMS:  A comprehensive review of systems was negative.   PHYSICAL EXAMINATION: General  appearance: alert, cooperative and no distress Neck: no adenopathy Lymph nodes: Cervical, supraclavicular, and axillary nodes normal. Resp: clear to auscultation bilaterally Cardio: regular rate and rhythm, S1, S2 normal, no murmur, click, rub or gallop GI: soft, non-tender; bowel sounds normal; no masses,  no organomegaly Extremities: extremities normal, atraumatic, no cyanosis or edema Neurologic: Alert and oriented X 3, normal strength and tone. Normal symmetric reflexes. Normal coordination and gait  ECOG PERFORMANCE STATUS: 0 - Asymptomatic  Blood pressure 146/84, pulse 79, temperature 97.9 F (36.6 C), temperature source Oral, height 5\' 11"  (1.803 m), weight 214 lb 8 oz (97.297 kg).  LABORATORY DATA: Lab Results  Component Value Date   WBC 4.3 12/18/2011   HGB 14.8 12/18/2011   HCT 44.0 12/18/2011   MCV 97.8 12/18/2011   PLT 245 12/18/2011      Chemistry      Component Value Date/Time   NA 140 12/18/2011 0918   NA 139 06/02/2010 0857   K 4.9* 12/18/2011 0918   K 4.1 06/02/2010 0857   CL 99 12/18/2011 0918   CL 105 06/02/2010 0857   CO2 28 12/18/2011 0918   CO2 25 06/02/2010 0857   BUN 15 12/18/2011 0918   BUN 15 06/02/2010 0857   CREATININE 1.2 12/18/2011 0918   CREATININE 1.39 06/02/2010 0857      Component Value Date/Time   CALCIUM 9.0 12/18/2011 0918   CALCIUM 9.3  06/02/2010 0857   ALKPHOS 70 12/18/2011 0918   ALKPHOS 60 06/02/2010 0857   AST 30 12/18/2011 0918   AST 24 06/02/2010 0857   ALT 19 06/02/2010 0857   BILITOT 0.80 12/18/2011 0918   BILITOT 1.0 06/02/2010 0857       RADIOGRAPHIC STUDIES: Ct Chest W Contrast  12/18/2011  *RADIOLOGY REPORT*  Clinical Data:  Lung cancer restaging.  Chemotherapy and radiation therapy complete 2009.  CT CHEST, ABDOMEN AND PELVIS WITH CONTRAST  Technique:  Multidetector CT imaging of the chest, abdomen and pelvis was performed following the standard protocol during bolus administration of intravenous contrast.  Contrast:  OMNIPAQUE IOHEXOL 300 MG/ML  SOLN  Comparison:  CT 06/11/2011  CT CHEST  Findings:  No axillary or supraclavicular lymphadenopathy.  No mediastinal or hilar lymphadenopathy.  No pericardial fluid. Esophagus is normal.  There is linear atelectasis in the right upper lobe abutting the suprahilar mediastinum.  This is unchanged from prior and likely represents post therapy change.  No new or suspicious pulmonary nodules.  IMPRESSION: 1.  No evidence of lung cancer recurrence. 2.  Stable post therapy change in the right upper lobe.  CT ABDOMEN AND PELVIS  Findings:  No focal hepatic lesion.  The gallbladder, pancreas, spleen, adrenal glands, and kidneys are normal.  The stomach, small bowel, and colon are normal.  Abdominal aorta normal caliber.  No retroperitoneal or periportal lymphadenopathy.  No free fluid in the pelvis.  Prostate gland and bladder normal.  Right inguinal hernia noted.   Review of  bone windows demonstrates no aggressive osseous lesions.  Several small sclerotic lesions appear benign including 11 mm lesion in the right iliac bone adjacent to the sacroiliac joint. Stable 10 mm sclerotic lesion at L1.  IMPRESSION:  1.   No evidence of lung cancer metastasis in the abdomen or pelvis. 2.  Right inguinal hernia.  Original Report Authenticated By: Genevive Bi, M.D.   Ct Abdomen Pelvis W Contrast  12/18/2011  *RADIOLOGY REPORT*  Clinical Data:  Lung cancer restaging.  Chemotherapy and radiation therapy complete 2009.  CT CHEST, ABDOMEN AND PELVIS WITH CONTRAST  Technique:  Multidetector CT imaging of the chest, abdomen and pelvis was performed following the standard protocol during bolus administration of intravenous contrast.  Contrast: OMNIPAQUE IOHEXOL 300 MG/ML  SOLN  Comparison:  CT 06/11/2011  CT CHEST  Findings:  No axillary or supraclavicular lymphadenopathy.  No mediastinal or hilar lymphadenopathy.  No pericardial fluid. Esophagus is normal.  There is linear atelectasis in the right  upper lobe abutting the suprahilar mediastinum.  This is unchanged from prior and likely represents post therapy change.  No new or suspicious pulmonary nodules.  IMPRESSION: 1.  No evidence of lung cancer recurrence. 2.  Stable post therapy change in the right upper lobe.  CT ABDOMEN AND PELVIS  Findings:  No focal hepatic lesion.  The gallbladder, pancreas, spleen, adrenal glands, and kidneys are normal.  The stomach, small bowel, and colon are normal.  Abdominal aorta normal caliber.  No retroperitoneal or periportal lymphadenopathy.  No free fluid in the pelvis.  Prostate gland and bladder normal.  Right inguinal hernia noted.   Review of  bone windows demonstrates no aggressive osseous lesions.  Several small sclerotic lesions appear benign including 11 mm lesion in the right iliac bone adjacent to the sacroiliac joint. Stable 10 mm sclerotic lesion at L1.  IMPRESSION:  1.   No evidence of lung cancer metastasis in the abdomen or pelvis.  2.  Right inguinal hernia.  Original Report Authenticated By: Genevive Bi, M.D.    ASSESSMENT: This is a very pleasant 66 years old African American male with metastatic non-small cell lung cancer status post systemic chemotherapy and has been observation for more than 4 years with no evidence for disease progression.  PLAN: I discussed the scan results with the patient and his wife and they recommended for him continuous observation for now with repeat CT scan of the chest, abdomen and pelvis in 6 months. He would come back for followup visit at that time.  All questions were answered. The patient knows to call the clinic with any problems, questions or concerns. We can certainly see the patient much sooner if necessary.

## 2011-12-21 NOTE — Telephone Encounter (Signed)
Gave pt appt calendar for December 2013 lab and MD in January 2014 , npo 4 hrs prior to CT

## 2011-12-21 NOTE — Progress Notes (Signed)
Spoke with pt and wife at [facility]. Questions and concerns addressed

## 2012-06-20 ENCOUNTER — Other Ambulatory Visit (HOSPITAL_BASED_OUTPATIENT_CLINIC_OR_DEPARTMENT_OTHER): Payer: Medicare Other | Admitting: Lab

## 2012-06-20 ENCOUNTER — Ambulatory Visit (HOSPITAL_COMMUNITY)
Admission: RE | Admit: 2012-06-20 | Discharge: 2012-06-20 | Disposition: A | Discharge status: Home / Self Care | Payer: Medicare Other | Source: Ambulatory Visit | Attending: Internal Medicine | Admitting: Internal Medicine

## 2012-06-20 DIAGNOSIS — C3490 Malignant neoplasm of unspecified part of unspecified bronchus or lung: Secondary | ICD-10-CM | POA: Insufficient documentation

## 2012-06-20 LAB — CBC WITH DIFFERENTIAL/PLATELET
BASO%: 0.3 % (ref 0.0–2.0)
Basophils Absolute: 0 10*3/uL (ref 0.0–0.1)
EOS%: 1.4 % (ref 0.0–7.0)
Eosinophils Absolute: 0.1 10*3/uL (ref 0.0–0.5)
HCT: 40.7 % (ref 38.4–49.9)
HGB: 14.1 g/dL (ref 13.0–17.1)
LYMPH%: 30.6 % (ref 14.0–49.0)
MCH: 33.4 pg (ref 27.2–33.4)
MCHC: 34.6 g/dL (ref 32.0–36.0)
MCV: 96.4 fL (ref 79.3–98.0)
MONO#: 0.5 10*3/uL (ref 0.1–0.9)
MONO%: 8.8 % (ref 0.0–14.0)
NEUT#: 3.2 10*3/uL (ref 1.5–6.5)
NEUT%: 58.9 % (ref 39.0–75.0)
Platelets: 456 10*3/uL — ABNORMAL HIGH (ref 140–400)
RBC: 4.22 10*6/uL (ref 4.20–5.82)
RDW: 13.2 % (ref 11.0–14.6)
WBC: 5.5 10*3/uL (ref 4.0–10.3)
lymph#: 1.7 10*3/uL (ref 0.9–3.3)

## 2012-06-20 LAB — COMPREHENSIVE METABOLIC PANEL (CC13)
ALT: 10 U/L (ref 0–55)
AST: 15 U/L (ref 5–34)
Albumin: 3.3 g/dL — ABNORMAL LOW (ref 3.5–5.0)
Alkaline Phosphatase: 63 U/L (ref 40–150)
BUN: 21 mg/dL (ref 7.0–26.0)
CO2: 23 mEq/L (ref 22–29)
Calcium: 9.6 mg/dL (ref 8.4–10.4)
Chloride: 101 mEq/L (ref 98–107)
Creatinine: 1.6 mg/dL — ABNORMAL HIGH (ref 0.7–1.3)
Glucose: 103 mg/dl — ABNORMAL HIGH (ref 70–99)
Potassium: 4.7 mEq/L (ref 3.5–5.1)
Sodium: 136 mEq/L (ref 136–145)
Total Bilirubin: 0.5 mg/dL (ref 0.20–1.20)
Total Protein: 7.7 g/dL (ref 6.4–8.3)

## 2012-06-20 MED ORDER — IOHEXOL 300 MG/ML  SOLN
100.0000 mL | Freq: Once | INTRAMUSCULAR | Status: AC | PRN
  Administered 2012-06-20: 100 mL via INTRAVENOUS

## 2012-06-23 ENCOUNTER — Ambulatory Visit: Payer: Medicare Other | Admitting: Internal Medicine

## 2012-06-24 ENCOUNTER — Ambulatory Visit: Payer: Medicare Other | Admitting: Oncology

## 2012-06-27 ENCOUNTER — Ambulatory Visit: Payer: Medicare Other | Admitting: Internal Medicine

## 2012-07-07 ENCOUNTER — Telehealth: Payer: Self-pay | Admitting: Internal Medicine

## 2012-07-07 NOTE — Telephone Encounter (Signed)
wife called and lm 1/15 about a f/u,l/m that they had an appt on 1/2 that was a no show and r/s the appt to 1/21        anne

## 2012-07-12 ENCOUNTER — Telehealth: Payer: Self-pay | Admitting: Internal Medicine

## 2012-07-12 ENCOUNTER — Encounter: Payer: Self-pay | Admitting: Internal Medicine

## 2012-07-12 ENCOUNTER — Ambulatory Visit (HOSPITAL_BASED_OUTPATIENT_CLINIC_OR_DEPARTMENT_OTHER): Payer: Medicare Other | Admitting: Internal Medicine

## 2012-07-12 VITALS — BP 145/85 | HR 95 | Temp 98.7°F | Resp 18 | Ht 71.0 in | Wt 208.8 lb

## 2012-07-12 DIAGNOSIS — C3490 Malignant neoplasm of unspecified part of unspecified bronchus or lung: Secondary | ICD-10-CM

## 2012-07-12 DIAGNOSIS — C3410 Malignant neoplasm of upper lobe, unspecified bronchus or lung: Secondary | ICD-10-CM

## 2012-07-12 NOTE — Progress Notes (Signed)
[facility] Telephone:(336) [phone]   Fax:(336) [phone]  OFFICE PROGRESS NOTE  Egbert Garibaldi, NP [facility] [address] [place] [address]  DIAGNOSIS: Metastatic non-small cell lung cancer, squamous cell carcinoma diagnosed in September 2008.   PRIOR THERAPY:  1. Status post right femoral fixation for impending fracture performed on May 12, 2007. 2. Status post palliative radiotherapy to the chest and right hip area under the care of Dr. Kathrynn Running between May 31, 2007, through June 20, 2007. 3. Status post 6 cycles of systemic chemotherapy with carboplatin and gemcitabine. Last dose was given September 13, 2007. 4. Status post 6 months' treatment with Coumadin for deep venous thrombosis of the right lower extremity that was diagnosed in March 2009.  CURRENT THERAPY: Observation.  INTERVAL HISTORY: Tommy Johnson 67 y.o. male returns to the clinic today for six-month followup visit accompanied his wife. The patient is feeling fine today with no specific complaints. He denied having any significant weight loss or night sweats. He denied having any chest pain, shortness breath, cough or hemoptysis. The patient had repeat CT scan of the chest, abdomen and pelvis performed recently and he is here for evaluation and discussion of his scan results.  MEDICAL HISTORY: Past Medical History  Diagnosis Date  . lung ca dx'd 02/2007  . Hypertension     ALLERGIES:   has no known allergies.  MEDICATIONS:  Current Outpatient Prescriptions  Medication Sig Dispense Refill  . b complex vitamins tablet Take 1 tablet by mouth daily.        . ciprofloxacin (CIPRO) 500 MG tablet       . enalapril (VASOTEC) 20 MG tablet Take 20 mg by mouth daily.        . fish oil-omega-3 fatty acids 1000 MG capsule Take 2 g by mouth daily.        Marland Kitchen levothyroxine (SYNTHROID, LEVOTHROID) 75 MCG tablet Take 75 mcg by mouth daily.        . Multiple Vitamin  (MULTIVITAMIN) tablet Take 1 tablet by mouth daily.        Marland Kitchen NIFEdipine (PROCARDIA-XL/ADALAT CC) 30 MG 24 hr tablet       . ramipril (ALTACE) 10 MG capsule       . Tamsulosin HCl (FLOMAX) 0.4 MG CAPS         REVIEW OF SYSTEMS:  A comprehensive review of systems was negative.   PHYSICAL EXAMINATION: General appearance: alert, cooperative and no distress Head: Normocephalic, without obvious abnormality, atraumatic Neck: no adenopathy Lymph nodes: Cervical, supraclavicular, and axillary nodes normal. Resp: clear to auscultation bilaterally Cardio: regular rate and rhythm, S1, S2 normal, no murmur, click, rub or gallop GI: soft, non-tender; bowel sounds normal; no masses,  no organomegaly Extremities: extremities normal, atraumatic, no cyanosis or edema  ECOG PERFORMANCE STATUS: 0 - Asymptomatic  Blood pressure 145/85, pulse 95, temperature 98.7 F (37.1 C), temperature source Oral, resp. rate 18, height 5\' 11"  (1.803 m), weight 208 lb 12.8 oz (94.711 kg).  LABORATORY DATA: Lab Results  Component Value Date   WBC 5.5 06/20/2012   HGB 14.1 06/20/2012   HCT 40.7 06/20/2012   MCV 96.4 06/20/2012   PLT 456* 06/20/2012      Chemistry      Component Value Date/Time   NA 136 06/20/2012 0824   NA 140 12/18/2011 0918   NA 139 06/02/2010 0857   K 4.7 06/20/2012 0824   K 4.9* 12/18/2011 5621  K 4.1 06/02/2010 0857   CL 101 06/20/2012 0824   CL 99 12/18/2011 0918   CL 105 06/02/2010 0857   CO2 23 06/20/2012 0824   CO2 28 12/18/2011 0918   CO2 25 06/02/2010 0857   BUN 21.0 06/20/2012 0824   BUN 15 12/18/2011 0918   BUN 15 06/02/2010 0857   CREATININE 1.6* 06/20/2012 0824   CREATININE 1.2 12/18/2011 0918   CREATININE 1.39 06/02/2010 0857      Component Value Date/Time   CALCIUM 9.6 06/20/2012 0824   CALCIUM 9.0 12/18/2011 0918   CALCIUM 9.3 06/02/2010 0857   ALKPHOS 63 06/20/2012 0824   ALKPHOS 70 12/18/2011 0918   ALKPHOS 60 06/02/2010 0857   AST 15 06/20/2012 0824   AST 30  12/18/2011 0918   AST 24 06/02/2010 0857   ALT 10 06/20/2012 0824   ALT 19 06/02/2010 0857   BILITOT 0.50 06/20/2012 0824   BILITOT 0.80 12/18/2011 0918   BILITOT 1.0 06/02/2010 0857       RADIOGRAPHIC STUDIES: Ct Chest W Contrast  06/20/2012  *RADIOLOGY REPORT*  Clinical Data:  Restaging lung cancer  CT CHEST, ABDOMEN AND PELVIS WITH CONTRAST  Technique:  Multidetector CT imaging of the chest, abdomen and pelvis was performed following the standard protocol during bolus administration of intravenous contrast.  Contrast: OMNIPAQUE IOHEXOL 300 MG/ML  SOLN  Comparison:  12/18/2011   CT CHEST  Findings:  Lungs/pleura: There is no pleural effusion identified. Paramediastinal consolidation and fibrosis is noted within both lungs compatible with changes of external beam radiation.  The appearance is similar to previous exam.  No suspicious pulmonary nodule or mass identified.  Heart/Mediastinum: Normal heart size.  No pericardial effusion. Calcifications within the left main, LAD and left circumflex coronary arteries noted.  No mediastinal or hilar adenopathy identified.  Bones/Musculoskeletal:  No worrisome lytic or sclerotic bone lesions.  IMPRESSION:  1.  No acute findings and no evidence for residual or recurrent tumor.   CT ABDOMEN AND PELVIS  Findings:  No focal liver abnormalities identified.  The spleen appears normal.  Normal appearance of the gallbladder.  The pancreas is unremarkable.  No biliary dilatation.  The spleen is negative.  Normal appearance of both adrenal glands.  The kidneys are unremarkable.  Prostate gland is enlarged measuring 6.2 cm.  There is mass effect upon the bladder base.  No adenopathy within the upper abdomen.  There is no pelvic adenopathy.  Bilateral inguinal hernias are again noted.  Calcified atherosclerotic disease affects the abdominal aorta.  No aneurysm noted.  The stomach appears normal.  The small bowel loops are unremarkable.  Normal appearance of the  colon.  Review of the visualized bony structures is significant for lumbar degenerative disc disease.  9 mm sclerotic structure within the L1 vertebra is stable from previous exam.  Similar appearance of 1.2 cm sclerotic lesion within the right iliac bone.  IMPRESSION:  1.  No acute findings and no evidence for metastasis to the abdomen or pelvis.   Original Report Authenticated By: Signa Kell, M.D.     ASSESSMENT: This is a very pleasant 67 years old African American male with metastatic non-small cell lung cancer diagnosed in September of 2008 status post palliative radiotherapy as well as systemic chemotherapy and has been observation since March 2009 with no evidence for disease recurrence.   PLAN: I discussed the scan results with the patient and his wife. I recommended for him to continue on observation for now with repeat  CT scan of the chest, abdomen and pelvis in 6 months. The patient was advised to call me immediately if he has any concerning symptoms in the interval especially any hemoptysis, shortness of breath, chest pain or neurological abnormalities  All questions were answered. The patient knows to call the clinic with any problems, questions or concerns. We can certainly see the patient much sooner if necessary.

## 2012-07-12 NOTE — Patient Instructions (Signed)
The recent scan showed no evidence for disease progression. Continue on observation with repeat CT scan of the chest, abdomen and pelvis in 6 months.

## 2012-07-12 NOTE — Telephone Encounter (Signed)
Gave pt apt for lab and CT a few days before MD visit on 7/21, gave pt oral contrast for CT

## 2013-01-06 ENCOUNTER — Encounter (HOSPITAL_COMMUNITY): Payer: Self-pay

## 2013-01-06 ENCOUNTER — Other Ambulatory Visit (HOSPITAL_BASED_OUTPATIENT_CLINIC_OR_DEPARTMENT_OTHER): Payer: Medicare Other | Admitting: Lab

## 2013-01-06 ENCOUNTER — Ambulatory Visit (HOSPITAL_COMMUNITY)
Admission: RE | Admit: 2013-01-06 | Discharge: 2013-01-06 | Disposition: A | Discharge status: Home / Self Care | Payer: Medicare Other | Source: Ambulatory Visit | Attending: Internal Medicine | Admitting: Internal Medicine

## 2013-01-06 DIAGNOSIS — Z9221 Personal history of antineoplastic chemotherapy: Secondary | ICD-10-CM | POA: Insufficient documentation

## 2013-01-06 DIAGNOSIS — C3490 Malignant neoplasm of unspecified part of unspecified bronchus or lung: Secondary | ICD-10-CM

## 2013-01-06 DIAGNOSIS — C7951 Secondary malignant neoplasm of bone: Secondary | ICD-10-CM | POA: Insufficient documentation

## 2013-01-06 DIAGNOSIS — K4020 Bilateral inguinal hernia, without obstruction or gangrene, not specified as recurrent: Secondary | ICD-10-CM | POA: Insufficient documentation

## 2013-01-06 DIAGNOSIS — Z923 Personal history of irradiation: Secondary | ICD-10-CM | POA: Insufficient documentation

## 2013-01-06 DIAGNOSIS — C7952 Secondary malignant neoplasm of bone marrow: Secondary | ICD-10-CM | POA: Insufficient documentation

## 2013-01-06 LAB — COMPREHENSIVE METABOLIC PANEL (CC13)
ALT: 15 U/L (ref 0–55)
AST: 21 U/L (ref 5–34)
Albumin: 3.5 g/dL (ref 3.5–5.0)
Alkaline Phosphatase: 67 U/L (ref 40–150)
BUN: 18.5 mg/dL (ref 7.0–26.0)
CO2: 23 mEq/L (ref 22–29)
Calcium: 8.9 mg/dL (ref 8.4–10.4)
Chloride: 106 mEq/L (ref 98–109)
Creatinine: 1.2 mg/dL (ref 0.7–1.3)
Glucose: 99 mg/dl (ref 70–140)
Potassium: 4 mEq/L (ref 3.5–5.1)
Sodium: 137 mEq/L (ref 136–145)
Total Bilirubin: 0.47 mg/dL (ref 0.20–1.20)
Total Protein: 6.9 g/dL (ref 6.4–8.3)

## 2013-01-06 LAB — CBC WITH DIFFERENTIAL/PLATELET
BASO%: 0.4 % (ref 0.0–2.0)
Basophils Absolute: 0 10*3/uL (ref 0.0–0.1)
EOS%: 1.7 % (ref 0.0–7.0)
Eosinophils Absolute: 0.1 10*3/uL (ref 0.0–0.5)
HCT: 41.6 % (ref 38.4–49.9)
HGB: 14.2 g/dL (ref 13.0–17.1)
LYMPH%: 35 % (ref 14.0–49.0)
MCH: 32.6 pg (ref 27.2–33.4)
MCHC: 34.2 g/dL (ref 32.0–36.0)
MCV: 95.4 fL (ref 79.3–98.0)
MONO#: 0.4 10*3/uL (ref 0.1–0.9)
MONO%: 9.6 % (ref 0.0–14.0)
NEUT#: 2.1 10*3/uL (ref 1.5–6.5)
NEUT%: 53.3 % (ref 39.0–75.0)
Platelets: 226 10*3/uL (ref 140–400)
RBC: 4.36 10*6/uL (ref 4.20–5.82)
RDW: 13.9 % (ref 11.0–14.6)
WBC: 4 10*3/uL (ref 4.0–10.3)
lymph#: 1.4 10*3/uL (ref 0.9–3.3)

## 2013-01-06 MED ORDER — IOHEXOL 300 MG/ML  SOLN
100.0000 mL | Freq: Once | INTRAMUSCULAR | Status: AC | PRN
  Administered 2013-01-06: 100 mL via INTRAVENOUS

## 2013-01-09 ENCOUNTER — Ambulatory Visit (HOSPITAL_BASED_OUTPATIENT_CLINIC_OR_DEPARTMENT_OTHER): Payer: Medicare Other | Admitting: Internal Medicine

## 2013-01-09 ENCOUNTER — Encounter: Payer: Self-pay | Admitting: Internal Medicine

## 2013-01-09 ENCOUNTER — Ambulatory Visit (HOSPITAL_COMMUNITY): Payer: Medicare Other

## 2013-01-09 ENCOUNTER — Telehealth: Payer: Self-pay | Admitting: Internal Medicine

## 2013-01-09 VITALS — BP 164/87 | HR 86 | Temp 97.9°F | Resp 18 | Ht 71.0 in | Wt 217.8 lb

## 2013-01-09 DIAGNOSIS — C3490 Malignant neoplasm of unspecified part of unspecified bronchus or lung: Secondary | ICD-10-CM

## 2013-01-09 NOTE — Patient Instructions (Signed)
No evidence for disease progression on his recent scan.  Followup visit in one year with repeat CT scan of the chest, abdomen and pelvis.

## 2013-01-09 NOTE — Telephone Encounter (Signed)
gv and printed appt sched and avs for pt....gv pt barium   °

## 2013-01-09 NOTE — Progress Notes (Signed)
[facility] Telephone:([phone]   Fax:(336) [phone]  OFFICE PROGRESS NOTE  Egbert Garibaldi, NP [facility] [address] [place] [address]  DIAGNOSIS: Metastatic non-small cell lung cancer, squamous cell carcinoma diagnosed in September 2008.   PRIOR THERAPY:  1. Status post right femoral fixation for impending fracture performed on May 12, 2007. 2. Status post palliative radiotherapy to the chest and right hip area under the care of Dr. Kathrynn Running between May 31, 2007, through June 20, 2007. 3. Status post 6 cycles of systemic chemotherapy with carboplatin and gemcitabine. Last dose was given September 13, 2007. 4. Status post 6 months' treatment with Coumadin for deep venous thrombosis of the right lower extremity that was diagnosed in March 2009.  CURRENT THERAPY: Observation.   INTERVAL HISTORY: Tommy Johnson 67 y.o. male returns to the clinic today for followup visit accompanied by his wife. The patient is feeling fine today with no specific complaints except for mild pain in the right index finger. He denied having any significant chest pain, shortness breath, cough or hemoptysis. The patient denied having any significant weight loss or night sweats. He had repeat CT scan of the chest, abdomen and pelvis performed recently and he is here for evaluation and discussion of his scan results.  MEDICAL HISTORY: Past Medical History  Diagnosis Date  . lung ca dx'd 02/2007  . Hypertension     ALLERGIES:  has No Known Allergies.  MEDICATIONS:  Current Outpatient Prescriptions  Medication Sig Dispense Refill  . b complex vitamins tablet Take 1 tablet by mouth daily.        . fish oil-omega-3 fatty acids 1000 MG capsule Take 2 g by mouth daily.        Marland Kitchen levothyroxine (SYNTHROID, LEVOTHROID) 75 MCG tablet Take 75 mcg by mouth daily.        . Multiple Vitamin (MULTIVITAMIN) tablet Take 1 tablet by mouth daily.        Marland Kitchen  NIFEdipine (PROCARDIA-XL/ADALAT CC) 30 MG 24 hr tablet       . ramipril (ALTACE) 10 MG capsule       . Tamsulosin HCl (FLOMAX) 0.4 MG CAPS        No current facility-administered medications for this visit.    REVIEW OF SYSTEMS:  A comprehensive review of systems was negative.   PHYSICAL EXAMINATION: General appearance: alert, cooperative and no distress Head: Normocephalic, without obvious abnormality, atraumatic Neck: no adenopathy Lymph nodes: Cervical, supraclavicular, and axillary nodes normal. Resp: clear to auscultation bilaterally Cardio: regular rate and rhythm, S1, S2 normal, no murmur, click, rub or gallop GI: soft, non-tender; bowel sounds normal; no masses,  no organomegaly Extremities: extremities normal, atraumatic, no cyanosis or edema  ECOG PERFORMANCE STATUS: 0 - Asymptomatic  Blood pressure 164/87, pulse 86, temperature 97.9 F (36.6 C), temperature source Oral, resp. rate 18, height 5\' 11"  (1.803 m), weight 217 lb 12.8 oz (98.793 kg).  LABORATORY DATA: Lab Results  Component Value Date   WBC 4.0 01/06/2013   HGB 14.2 01/06/2013   HCT 41.6 01/06/2013   MCV 95.4 01/06/2013   PLT 226 01/06/2013      Chemistry      Component Value Date/Time   NA 137 01/06/2013 0808   NA 140 12/18/2011 0918   NA 139 06/02/2010 0857   K 4.0 01/06/2013 0808   K 4.9* 12/18/2011 0918   K 4.1 06/02/2010 0857   CL 101 06/20/2012 0824  CL 99 12/18/2011 0918   CL 105 06/02/2010 0857   CO2 23 01/06/2013 0808   CO2 28 12/18/2011 0918   CO2 25 06/02/2010 0857   BUN 18.5 01/06/2013 0808   BUN 15 12/18/2011 0918   BUN 15 06/02/2010 0857   CREATININE 1.2 01/06/2013 0808   CREATININE 1.2 12/18/2011 0918   CREATININE 1.39 06/02/2010 0857      Component Value Date/Time   CALCIUM 8.9 01/06/2013 0808   CALCIUM 9.0 12/18/2011 0918   CALCIUM 9.3 06/02/2010 0857   ALKPHOS 67 01/06/2013 0808   ALKPHOS 70 12/18/2011 0918   ALKPHOS 60 06/02/2010 0857   AST 21 01/06/2013 0808   AST 30 12/18/2011 0918     AST 24 06/02/2010 0857   ALT 15 01/06/2013 0808   ALT 19 06/02/2010 0857   BILITOT 0.47 01/06/2013 0808   BILITOT 0.80 12/18/2011 0918   BILITOT 1.0 06/02/2010 0857       RADIOGRAPHIC STUDIES: Ct Chest W Contrast  01/06/2013   *RADIOLOGY REPORT*  Clinical Data:  Metastatic non-small cell lung cancer, squamous cell carcinoma diagnosed in September 2008.  Prior radiation therapy and chemotherapy. Prior metastatic lesions in the lumbar spine, right iliac bone, and right proximal femur.  CT CHEST, ABDOMEN AND PELVIS WITH CONTRAST  Technique:  Multidetector CT imaging of the chest, abdomen and pelvis was performed following the standard protocol during bolus administration of intravenous contrast.  Contrast: OMNIPAQUE IOHEXOL 300 MG/ML  SOLN  Comparison:  Multiple exams, including 06/20/2012    CT CHEST  Findings:  Unchanged right paramediastinal consolidation/fibrosis with volume loss in much of the right upper lobe and chronic attenuation of the right upper lobe pulmonary artery likely related to hypoventilation.  Unchanged mild fibrosis medially in the left upper lobe.  No new mass or new prominence of the consolidative region is readily apparent  Trace anterior pericardial fluid, stable.  No new adenopathy. Heart size within normal limits. Probable scarring posteriorly in the right lower lobe on image 26 of series 5, stable from 2009. No osseous thoracic metastatic lesions are identified.  IMPRESSION:  1.  Unchanged post-therapy findings without active malignancy identified in the chest.    CT ABDOMEN AND PELVIS  Findings:  The liver, spleen, pancreas, and adrenal glands appear unremarkable.  The gallbladder and biliary system appear unremarkable.  The kidneys appear unremarkable, as do the proximal ureters.  No pathologic retroperitoneal or porta hepatis adenopathy is identified.  Aortoiliac atherosclerosis noted.  Appendix normal.  Urinary bladder normal.  Stable prominence the prostate gland.   Bilateral inguinal hernias primarily contain adipose tissue, with continued marginal thickening of the right inguinal hernia.  Right femoral nail noted.  Stable 1.2 cm right iliac bone sclerotic lesion.  Stable 0.8 cm sclerotic lesion in the L1 vertebral body. Stable 0.5 cm sclerotic lesion in the T12 vertebral body.  IMPRESSION: 1.  Stable sclerotic lesions corresponding to treated metastases in the right iliac bone and left L1 vertebral body. 2.  No new or progressive malignancy identified. 3.  Stable bilateral inguinal hernia is, with chronically thickened margin of the right inguinal hernia.   Original Report Authenticated By: Gaylyn Rong, M.D.   ASSESSMENT AND PLAN: This is a very pleasant 67 years old African American male with metastatic non-small cell lung cancer, squamous cell carcinoma diagnosed in September 2008 status post systemic chemotherapy and has been observation since March of 2009 with no evidence for disease progression. I discussed the scan results with the patient  and his wife. I recommended for him to continue on observation with repeat CT scan of the chest, abdomen and pelvis in one year. For the right index finger pain, I recommended for the patient to see an orthopedic surgeon for evaluation. He was advised to call immediately if he has any concerning symptoms in the interval.  All questions were answered. The patient knows to call the clinic with any problems, questions or concerns. We can certainly see the patient much sooner if necessary.

## 2014-01-08 ENCOUNTER — Ambulatory Visit (HOSPITAL_COMMUNITY)
Admission: RE | Admit: 2014-01-08 | Discharge: 2014-01-08 | Disposition: A | Discharge status: Home / Self Care | Payer: Medicare Other | Source: Ambulatory Visit | Attending: Internal Medicine | Admitting: Internal Medicine

## 2014-01-08 ENCOUNTER — Encounter (HOSPITAL_COMMUNITY): Payer: Self-pay

## 2014-01-08 ENCOUNTER — Other Ambulatory Visit (HOSPITAL_BASED_OUTPATIENT_CLINIC_OR_DEPARTMENT_OTHER): Payer: Medicare Other

## 2014-01-08 DIAGNOSIS — M899 Disorder of bone, unspecified: Secondary | ICD-10-CM | POA: Insufficient documentation

## 2014-01-08 DIAGNOSIS — Y842 Radiological procedure and radiotherapy as the cause of abnormal reaction of the patient, or of later complication, without mention of misadventure at the time of the procedure: Secondary | ICD-10-CM | POA: Insufficient documentation

## 2014-01-08 DIAGNOSIS — Z9221 Personal history of antineoplastic chemotherapy: Secondary | ICD-10-CM | POA: Insufficient documentation

## 2014-01-08 DIAGNOSIS — C3490 Malignant neoplasm of unspecified part of unspecified bronchus or lung: Secondary | ICD-10-CM | POA: Insufficient documentation

## 2014-01-08 DIAGNOSIS — N400 Enlarged prostate without lower urinary tract symptoms: Secondary | ICD-10-CM | POA: Insufficient documentation

## 2014-01-08 DIAGNOSIS — K4020 Bilateral inguinal hernia, without obstruction or gangrene, not specified as recurrent: Secondary | ICD-10-CM | POA: Insufficient documentation

## 2014-01-08 DIAGNOSIS — T66XXXA Radiation sickness, unspecified, initial encounter: Secondary | ICD-10-CM | POA: Insufficient documentation

## 2014-01-08 DIAGNOSIS — M949 Disorder of cartilage, unspecified: Secondary | ICD-10-CM

## 2014-01-08 DIAGNOSIS — Z923 Personal history of irradiation: Secondary | ICD-10-CM | POA: Insufficient documentation

## 2014-01-08 LAB — CBC WITH DIFFERENTIAL/PLATELET
BASO%: 0.6 % (ref 0.0–2.0)
Basophils Absolute: 0 10*3/uL (ref 0.0–0.1)
EOS%: 2 % (ref 0.0–7.0)
Eosinophils Absolute: 0.1 10*3/uL (ref 0.0–0.5)
HCT: 45 % (ref 38.4–49.9)
HGB: 15 g/dL (ref 13.0–17.1)
LYMPH%: 34.1 % (ref 14.0–49.0)
MCH: 31.9 pg (ref 27.2–33.4)
MCHC: 33.4 g/dL (ref 32.0–36.0)
MCV: 95.6 fL (ref 79.3–98.0)
MONO#: 0.4 10*3/uL (ref 0.1–0.9)
MONO%: 8.9 % (ref 0.0–14.0)
NEUT#: 2.3 10*3/uL (ref 1.5–6.5)
NEUT%: 54.4 % (ref 39.0–75.0)
Platelets: 252 10*3/uL (ref 140–400)
RBC: 4.7 10*6/uL (ref 4.20–5.82)
RDW: 13.6 % (ref 11.0–14.6)
WBC: 4.2 10*3/uL (ref 4.0–10.3)
lymph#: 1.4 10*3/uL (ref 0.9–3.3)

## 2014-01-08 LAB — COMPREHENSIVE METABOLIC PANEL (CC13)
ALT: 16 U/L (ref 0–55)
AST: 17 U/L (ref 5–34)
Albumin: 3.5 g/dL (ref 3.5–5.0)
Alkaline Phosphatase: 62 U/L (ref 40–150)
Anion Gap: 7 mEq/L (ref 3–11)
BUN: 14 mg/dL (ref 7.0–26.0)
CO2: 25 mEq/L (ref 22–29)
Calcium: 9.1 mg/dL (ref 8.4–10.4)
Chloride: 106 mEq/L (ref 98–109)
Creatinine: 1.2 mg/dL (ref 0.7–1.3)
Glucose: 104 mg/dl (ref 70–140)
Potassium: 4.2 mEq/L (ref 3.5–5.1)
Sodium: 139 mEq/L (ref 136–145)
Total Bilirubin: 0.43 mg/dL (ref 0.20–1.20)
Total Protein: 6.9 g/dL (ref 6.4–8.3)

## 2014-01-08 MED ORDER — IOHEXOL 300 MG/ML  SOLN
100.0000 mL | Freq: Once | INTRAMUSCULAR | Status: AC | PRN
  Administered 2014-01-08: 100 mL via INTRAVENOUS

## 2014-01-09 ENCOUNTER — Ambulatory Visit (HOSPITAL_BASED_OUTPATIENT_CLINIC_OR_DEPARTMENT_OTHER): Payer: Medicare Other | Admitting: Internal Medicine

## 2014-01-09 ENCOUNTER — Telehealth: Payer: Self-pay | Admitting: Internal Medicine

## 2014-01-09 ENCOUNTER — Encounter: Payer: Self-pay | Admitting: Internal Medicine

## 2014-01-09 VITALS — BP 174/95 | HR 88 | Temp 98.9°F | Resp 18 | Ht 71.0 in | Wt 215.5 lb

## 2014-01-09 DIAGNOSIS — C3490 Malignant neoplasm of unspecified part of unspecified bronchus or lung: Secondary | ICD-10-CM

## 2014-01-09 DIAGNOSIS — Z85118 Personal history of other malignant neoplasm of bronchus and lung: Secondary | ICD-10-CM

## 2014-01-09 NOTE — Progress Notes (Signed)
[place] Telephone:(336) [phone]   Fax:(336) [phone]  OFFICE PROGRESS NOTE  [name], NP [facility] [place] [address]  DIAGNOSIS: Metastatic non-small cell lung cancer, squamous cell carcinoma diagnosed in September 2008.   PRIOR THERAPY:  1. Status post right femoral fixation for impending fracture performed on May 12, 2007. 2. Status post palliative radiotherapy to the chest and right hip area under the care of Dr. Tammi Klippel between May 31, 2007, through June 20, 2007. 3. Status post 6 cycles of systemic chemotherapy with carboplatin and gemcitabine. Last dose was given September 13, 2007. 4. Status post 6 months' treatment with Coumadin for deep venous thrombosis of the right lower extremity that was diagnosed in March 2009.  CURRENT THERAPY: Observation.  INTERVAL HISTORY: Tommy Johnson 68 y.o. male returns to the clinic today for annual followup visit accompanied by his wife. He has been observation for the last year with no significant issues. The patient is feeling fine today with no specific complaints. He denied having any significant chest pain, shortness of breath, cough or hemoptysis. The patient denied having any significant weight loss or night sweats. He had repeat CT scan of the chest, abdomen and pelvis performed recently and he is here for evaluation and discussion of his scan results.  MEDICAL HISTORY: Past Medical History  Diagnosis Date  . Hypertension   . lung ca dx'd 02/2007    ALLERGIES:  has No Known Allergies.  MEDICATIONS:  Current Outpatient Prescriptions  Medication Sig Dispense Refill  . b complex vitamins tablet Take 1 tablet by mouth daily.        . fish oil-omega-3 fatty acids 1000 MG capsule Take 2 g by mouth daily.        Marland Kitchen levothyroxine (SYNTHROID, LEVOTHROID) 75 MCG tablet Take 75 mcg by mouth daily.        . Multiple Vitamin (MULTIVITAMIN) tablet Take 1 tablet  by mouth daily.        Marland Kitchen NIFEdipine (PROCARDIA-XL/ADALAT CC) 30 MG 24 hr tablet        No current facility-administered medications for this visit.    REVIEW OF SYSTEMS:  A comprehensive review of systems was negative.   PHYSICAL EXAMINATION: General appearance: alert, cooperative and no distress Head: Normocephalic, without obvious abnormality, atraumatic Neck: no adenopathy Lymph nodes: Cervical, supraclavicular, and axillary nodes normal. Resp: clear to auscultation bilaterally Cardio: regular rate and rhythm, S1, S2 normal, no murmur, click, rub or gallop GI: soft, non-tender; bowel sounds normal; no masses,  no organomegaly Extremities: extremities normal, atraumatic, no cyanosis or edema  ECOG PERFORMANCE STATUS: 0 - Asymptomatic  Blood pressure 174/95, pulse 88, temperature 98.9 F (37.2 C), temperature source Oral, resp. rate 18, height 5\' 11"  (1.803 m), weight 215 lb 8 oz (97.75 kg), SpO2 100.00%.  LABORATORY DATA: Lab Results  Component Value Date   WBC 4.2 01/08/2014   HGB 15.0 01/08/2014   HCT 45.0 01/08/2014   MCV 95.6 01/08/2014   PLT 252 01/08/2014      Chemistry      Component Value Date/Time   NA 139 01/08/2014 0754   NA 140 12/18/2011 0918   NA 139 06/02/2010 0857   K 4.2 01/08/2014 0754   K 4.9* 12/18/2011 0918   K 4.1 06/02/2010 0857   CL 101 06/20/2012 0824   CL 99 12/18/2011 0918   CL 105 06/02/2010 0857   CO2 25 01/08/2014 0754  CO2 28 12/18/2011 0918   CO2 25 06/02/2010 0857   BUN 14.0 01/08/2014 0754   BUN 15 12/18/2011 0918   BUN 15 06/02/2010 0857   CREATININE 1.2 01/08/2014 0754   CREATININE 1.2 12/18/2011 0918   CREATININE 1.39 06/02/2010 0857      Component Value Date/Time   CALCIUM 9.1 01/08/2014 0754   CALCIUM 9.0 12/18/2011 0918   CALCIUM 9.3 06/02/2010 0857   ALKPHOS 62 01/08/2014 0754   ALKPHOS 70 12/18/2011 0918   ALKPHOS 60 06/02/2010 0857   AST 17 01/08/2014 0754   AST 30 12/18/2011 0918   AST 24 06/02/2010 0857   ALT 16 01/08/2014 0754     ALT 27 12/18/2011 0918   ALT 19 06/02/2010 0857   BILITOT 0.43 01/08/2014 0754   BILITOT 0.80 12/18/2011 0918   BILITOT 1.0 06/02/2010 0857       RADIOGRAPHIC STUDIES: Ct Chest W Contrast  01/08/2014   CLINICAL DATA:  Followup metastatic squamous cell lung carcinoma. Completed chemotherapy and radiation therapy.  EXAM: CT CHEST, ABDOMEN, AND PELVIS WITH CONTRAST  TECHNIQUE: Multidetector CT imaging of the chest, abdomen and pelvis was performed following the standard protocol during bolus administration of intravenous contrast.  CONTRAST:  132mL OMNIPAQUE IOHEXOL 300 MG/ML  SOLN  COMPARISON:  01/06/2013  FINDINGS:   CT CHEST FINDINGS  Post radiation changes in the right paramediastinal lung zone remains stable. No suspicious pulmonary nodules or masses are identified. No evidence of acute infiltrate or central endobronchial obstruction. No evidence of hilar or mediastinal lymphadenopathy. No adenopathy seen elsewhere within the thorax. No evidence pleural or pericardial effusion.    CT ABDOMEN AND PELVIS FINDINGS  The liver, gallbladder, pancreas, spleen, and adrenal glands are normal in appearance. Bilateral renal parenchymal scarring is stable. No evidence of renal masses or hydronephrosis. No other soft tissue masses or pathologically enlarged lymph nodes are identified within the abdomen or pelvis. Mildly enlarged prostate gland is stable.  No evidence of inflammatory process or abnormal fluid collections. No evidence of bowel wall thickening or dilatation. Small bilateral inguinal hernias containing only fat are stable. Surgical hardware again seen in the right hip. Small sub-cm sclerotic bone lesions in the right ilium and L1 vertebral body remain stable.    IMPRESSION: Stable post treatment changes in the right hemithorax.  Stable small sclerotic bone lesions in the right ilium and L1 vertebral body.  No new or progressive malignancy identified within the chest, abdomen or pelvis.  Stable  small fat containing bilateral inguinal hernias and mildly enlarged prostate.   Electronically Signed   By: Earle Gell M.D.   On: 01/08/2014 09:26   ASSESSMENT AND PLAN: This is a very pleasant 69 years old African American male with metastatic non-small cell lung cancer, squamous cell carcinoma diagnosed in September 2008 status post systemic chemotherapy and has been observation since March of 2009 with no evidence for disease progression. I discussed the scan results with the patient and his wife. I recommended for him to continue on observation with repeat CT scan of the chest, abdomen and pelvis in one year. He was advised to call immediately if he has any concerning symptoms in the interval.  All questions were answered. The patient knows to call the clinic with any problems, questions or concerns. We can certainly see the patient much sooner if necessary.  Disclaimer: This note was dictated with voice recognition software. Similar sounding words can inadvertently be transcribed and may not be corrected upon review.

## 2014-01-09 NOTE — Telephone Encounter (Signed)
gv adn printed appt sched and avs for pt for July 2016...gv pt barium

## 2015-01-02 ENCOUNTER — Other Ambulatory Visit: Payer: Self-pay | Admitting: Medical Oncology

## 2015-01-02 DIAGNOSIS — C3490 Malignant neoplasm of unspecified part of unspecified bronchus or lung: Secondary | ICD-10-CM

## 2015-01-07 ENCOUNTER — Encounter (HOSPITAL_COMMUNITY): Payer: Self-pay

## 2015-01-07 ENCOUNTER — Other Ambulatory Visit (HOSPITAL_BASED_OUTPATIENT_CLINIC_OR_DEPARTMENT_OTHER): Payer: Medicare Other

## 2015-01-07 ENCOUNTER — Ambulatory Visit (HOSPITAL_COMMUNITY)
Admission: RE | Admit: 2015-01-07 | Discharge: 2015-01-07 | Disposition: A | Discharge status: Home / Self Care | Payer: Medicare Other | Source: Ambulatory Visit | Attending: Internal Medicine | Admitting: Internal Medicine

## 2015-01-07 DIAGNOSIS — C3491 Malignant neoplasm of unspecified part of right bronchus or lung: Secondary | ICD-10-CM | POA: Insufficient documentation

## 2015-01-07 DIAGNOSIS — C3490 Malignant neoplasm of unspecified part of unspecified bronchus or lung: Secondary | ICD-10-CM

## 2015-01-07 DIAGNOSIS — R0602 Shortness of breath: Secondary | ICD-10-CM | POA: Insufficient documentation

## 2015-01-07 LAB — CBC WITH DIFFERENTIAL/PLATELET
BASO%: 0.6 % (ref 0.0–2.0)
Basophils Absolute: 0 10*3/uL (ref 0.0–0.1)
EOS%: 1.8 % (ref 0.0–7.0)
Eosinophils Absolute: 0.1 10*3/uL (ref 0.0–0.5)
HCT: 46.8 % (ref 38.4–49.9)
HGB: 15.7 g/dL (ref 13.0–17.1)
LYMPH%: 28 % (ref 14.0–49.0)
MCH: 32.2 pg (ref 27.2–33.4)
MCHC: 33.5 g/dL (ref 32.0–36.0)
MCV: 96.2 fL (ref 79.3–98.0)
MONO#: 0.4 10*3/uL (ref 0.1–0.9)
MONO%: 8.5 % (ref 0.0–14.0)
NEUT#: 2.7 10*3/uL (ref 1.5–6.5)
NEUT%: 61.1 % (ref 39.0–75.0)
Platelets: 233 10*3/uL (ref 140–400)
RBC: 4.86 10*6/uL (ref 4.20–5.82)
RDW: 13.3 % (ref 11.0–14.6)
WBC: 4.4 10*3/uL (ref 4.0–10.3)
lymph#: 1.2 10*3/uL (ref 0.9–3.3)

## 2015-01-07 LAB — COMPREHENSIVE METABOLIC PANEL (CC13)
ALT: 16 U/L (ref 0–55)
AST: 16 U/L (ref 5–34)
Albumin: 3.7 g/dL (ref 3.5–5.0)
Alkaline Phosphatase: 68 U/L (ref 40–150)
Anion Gap: 8 mEq/L (ref 3–11)
BUN: 18.3 mg/dL (ref 7.0–26.0)
CO2: 24 mEq/L (ref 22–29)
Calcium: 9.4 mg/dL (ref 8.4–10.4)
Chloride: 106 mEq/L (ref 98–109)
Creatinine: 1.2 mg/dL (ref 0.7–1.3)
EGFR: 74 mL/min/{1.73_m2} — ABNORMAL LOW (ref >90)
Glucose: 98 mg/dl (ref 70–140)
Potassium: 4.1 mEq/L (ref 3.5–5.1)
Sodium: 137 mEq/L (ref 136–145)
Total Bilirubin: 0.8 mg/dL (ref 0.20–1.20)
Total Protein: 7 g/dL (ref 6.4–8.3)

## 2015-01-07 MED ORDER — IOHEXOL 300 MG/ML  SOLN
100.0000 mL | Freq: Once | INTRAMUSCULAR | Status: AC | PRN
  Administered 2015-01-07: 100 mL via INTRAVENOUS

## 2015-01-14 ENCOUNTER — Ambulatory Visit (HOSPITAL_BASED_OUTPATIENT_CLINIC_OR_DEPARTMENT_OTHER): Payer: Medicare Other | Admitting: Internal Medicine

## 2015-01-14 ENCOUNTER — Encounter: Payer: Self-pay | Admitting: Internal Medicine

## 2015-01-14 ENCOUNTER — Telehealth: Payer: Self-pay | Admitting: Internal Medicine

## 2015-01-14 VITALS — BP 150/84 | HR 97 | Temp 98.1°F | Resp 19 | Ht 71.0 in | Wt 216.0 lb

## 2015-01-14 DIAGNOSIS — Z86718 Personal history of other venous thrombosis and embolism: Secondary | ICD-10-CM | POA: Diagnosis not present

## 2015-01-14 DIAGNOSIS — C3490 Malignant neoplasm of unspecified part of unspecified bronchus or lung: Secondary | ICD-10-CM | POA: Diagnosis not present

## 2015-01-14 NOTE — Progress Notes (Signed)
[place] [phone]   [phone]  OFFICE PROGRESS NOTE  [name], NP [facility] [place] [address]  DIAGNOSIS: Metastatic non-small cell lung cancer, squamous cell carcinoma diagnosed in September 2008.   PRIOR THERAPY:  1. Status post right femoral fixation for impending fracture performed on May 12, 2007. 2. Status post palliative radiotherapy to the chest and right hip area under the care of Dr. Tammi Klippel between May 31, 2007, through June 20, 2007. 3. Status post 6 cycles of systemic chemotherapy with carboplatin and gemcitabine. Last dose was given September 13, 2007. 4. Status post 6 months' treatment with Coumadin for deep venous thrombosis of the right lower extremity that was diagnosed in March 2009.  CURRENT THERAPY: Observation.  INTERVAL HISTORY: Tommy Johnson 69 y.o. male returns to the clinic today for annual followup visit accompanied by his wife. The patient is feeling fine today with no specific complaints except for arthritis. He denied having any significant chest pain, shortness of breath, cough or hemoptysis. The patient denied having any significant weight loss or night sweats. He had repeat CT scan of the chest, abdomen and pelvis performed recently and he is here for evaluation and discussion of his scan results.  MEDICAL HISTORY: Past Medical History  Diagnosis Date  . Hypertension   . lung ca dx'd 02/2007    ALLERGIES:  has No Known Allergies.  MEDICATIONS:  Current Outpatient Prescriptions  Medication Sig Dispense Refill  . fish oil-omega-3 fatty acids 1000 MG capsule Take 2 g by mouth daily.      Marland Kitchen levothyroxine (SYNTHROID, LEVOTHROID) 150 MCG tablet Take 150 mcg by mouth daily.    . Multiple Vitamin (MULTIVITAMIN) tablet Take 1 tablet by mouth daily.      Marland Kitchen NIFEdipine (PROCARDIA-XL/ADALAT CC) 30 MG 24 hr tablet     . b complex vitamins tablet Take 1  tablet by mouth daily.       No current facility-administered medications for this visit.    REVIEW OF SYSTEMS:  A comprehensive review of systems was negative.   PHYSICAL EXAMINATION: General appearance: alert, cooperative and no distress Head: Normocephalic, without obvious abnormality, atraumatic Neck: no adenopathy Lymph nodes: Cervical, supraclavicular, and axillary nodes normal. Resp: clear to auscultation bilaterally Cardio: regular rate and rhythm, S1, S2 normal, no murmur, click, rub or gallop GI: soft, non-tender; bowel sounds normal; no masses,  no organomegaly Extremities: extremities normal, atraumatic, no cyanosis or edema  ECOG PERFORMANCE STATUS: 0 - Asymptomatic  Blood pressure 150/84, pulse 97, temperature 98.1 F (36.7 C), temperature source Oral, resp. rate 19, height '5\' 11"'$  (1.803 m), weight 216 lb (97.977 kg), SpO2 100 %.  LABORATORY DATA: Lab Results  Component Value Date   WBC 4.4 01/07/2015   HGB 15.7 01/07/2015   HCT 46.8 01/07/2015   MCV 96.2 01/07/2015   PLT 233 01/07/2015      Chemistry      Component Value Date/Time   NA 137 01/07/2015 0749   NA 140 12/18/2011 0918   NA 139 06/02/2010 0857   K 4.1 01/07/2015 0749   K 4.9* 12/18/2011 0918   K 4.1 06/02/2010 0857   CL 101 06/20/2012 0824   CL 99 12/18/2011 0918   CL 105 06/02/2010 0857   CO2 24 01/07/2015 0749   CO2 28 12/18/2011 0918   CO2 25 06/02/2010 0857   BUN 18.3 01/07/2015 0749   BUN 15 12/18/2011  0918   BUN 15 06/02/2010 0857   CREATININE 1.2 01/07/2015 0749   CREATININE 1.2 12/18/2011 0918   CREATININE 1.39 06/02/2010 0857      Component Value Date/Time   CALCIUM 9.4 01/07/2015 0749   CALCIUM 9.0 12/18/2011 0918   CALCIUM 9.3 06/02/2010 0857   ALKPHOS 68 01/07/2015 0749   ALKPHOS 70 12/18/2011 0918   ALKPHOS 60 06/02/2010 0857   AST 16 01/07/2015 0749   AST 30 12/18/2011 0918   AST 24 06/02/2010 0857   ALT 16 01/07/2015 0749   ALT 27 12/18/2011 0918   ALT 19  06/02/2010 0857   BILITOT 0.80 01/07/2015 0749   BILITOT 0.80 12/18/2011 0918   BILITOT 1.0 06/02/2010 0857       RADIOGRAPHIC STUDIES: Ct Chest W Contrast  01/07/2015   CLINICAL DATA:  Subsequent encounter for right-sided lung cancer with some shortness of breath.  EXAM: CT CHEST, ABDOMEN, AND PELVIS WITH CONTRAST  TECHNIQUE: Multidetector CT imaging of the chest, abdomen and pelvis was performed following the standard protocol during bolus administration of intravenous contrast.  CONTRAST:  110m OMNIPAQUE IOHEXOL 300 MG/ML  SOLN  COMPARISON:  01/08/2014.  FINDINGS: CT CHEST FINDINGS  Mediastinum/Nodes: There is no axillary lymphadenopathy. No mediastinal or left hilar lymphadenopathy. Abnormal soft tissue attenuation in the right hilum is unchanged in the interval. Esophagus is normal. Heart size is normal. Trace anterior pericardial fluid or thickening is stable.  Lungs/Pleura: Post radiation fibrosis in the medial right lung is stable in the interval. As result of this volume loss, the right lung is hyperexpanded. No pulmonary edema or pleural effusion.  Tiny ground-glass nodule in the posterior right lung (image 28 series 4 is unchanged in the interval in also stable back to a study from 01/06/2013. Lungs are otherwise clear.  Musculoskeletal: Bone windows reveal no worrisome lytic or sclerotic osseous lesions.  CT ABDOMEN FINDINGS  Hepatobiliary: No focal abnormality within the liver parenchyma. There is no evidence for gallstones, gallbladder wall thickening, or pericholecystic fluid. No intrahepatic or extrahepatic biliary dilation.  Pancreas: No focal mass lesion. No dilatation of the main duct. No intraparenchymal cyst. No peripancreatic edema.  Spleen: No splenomegaly. No focal mass lesion.  Adrenals/Urinary Tract: No adrenal nodule or mass. Scattered areas of cortical scarring are noted in both kidneys. No enhancing renal lesion. No hydronephrosis.  Stomach/Bowel: Stomach is nondistended. No  gastric wall thickening. No evidence of outlet obstruction. Duodenum is normally positioned as is the ligament of Treitz. Visualized portions of the small bowel and colon are unremarkable.  Vascular/Lymphatic: There is abdominal aortic atherosclerosis without aneurysm. No lymphadenopathy in the abdomen.  Other: No intraperitoneal free fluid.  Musculoskeletal: Small sclerotic lesion in the right iliac bone, adjacent to the SI joint is stable. Small 9 mm sclerotic density in the left aspect of the L1 vertebral body is also unchanged.  IMPRESSION: 1. Stable exam. Post radiation scarring noted in the medial right lung. No new or progressive findings to suggest recurrent disease. 2. Stable appearance of the small sclerotic lesions in the right iliac bone and L1 vertebral body.   Electronically Signed   By: EMisty StanleyM.D.   On: 01/07/2015 09:48   Ct Abdomen W Contrast  01/07/2015   CLINICAL DATA:  Subsequent encounter for right-sided lung cancer with some shortness of breath.  EXAM: CT CHEST, ABDOMEN, AND PELVIS WITH CONTRAST  TECHNIQUE: Multidetector CT imaging of the chest, abdomen and pelvis was performed following the standard protocol during bolus administration  of intravenous contrast.  CONTRAST:  174m OMNIPAQUE IOHEXOL 300 MG/ML  SOLN  COMPARISON:  01/08/2014.  FINDINGS: CT CHEST FINDINGS  Mediastinum/Nodes: There is no axillary lymphadenopathy. No mediastinal or left hilar lymphadenopathy. Abnormal soft tissue attenuation in the right hilum is unchanged in the interval. Esophagus is normal. Heart size is normal. Trace anterior pericardial fluid or thickening is stable.  Lungs/Pleura: Post radiation fibrosis in the medial right lung is stable in the interval. As result of this volume loss, the right lung is hyperexpanded. No pulmonary edema or pleural effusion.  Tiny ground-glass nodule in the posterior right lung (image 28 series 4 is unchanged in the interval in also stable back to a study from  01/06/2013. Lungs are otherwise clear.  Musculoskeletal: Bone windows reveal no worrisome lytic or sclerotic osseous lesions.  CT ABDOMEN FINDINGS  Hepatobiliary: No focal abnormality within the liver parenchyma. There is no evidence for gallstones, gallbladder wall thickening, or pericholecystic fluid. No intrahepatic or extrahepatic biliary dilation.  Pancreas: No focal mass lesion. No dilatation of the main duct. No intraparenchymal cyst. No peripancreatic edema.  Spleen: No splenomegaly. No focal mass lesion.  Adrenals/Urinary Tract: No adrenal nodule or mass. Scattered areas of cortical scarring are noted in both kidneys. No enhancing renal lesion. No hydronephrosis.  Stomach/Bowel: Stomach is nondistended. No gastric wall thickening. No evidence of outlet obstruction. Duodenum is normally positioned as is the ligament of Treitz. Visualized portions of the small bowel and colon are unremarkable.  Vascular/Lymphatic: There is abdominal aortic atherosclerosis without aneurysm. No lymphadenopathy in the abdomen.  Other: No intraperitoneal free fluid.  Musculoskeletal: Small sclerotic lesion in the right iliac bone, adjacent to the SI joint is stable. Small 9 mm sclerotic density in the left aspect of the L1 vertebral body is also unchanged.  IMPRESSION: 1. Stable exam. Post radiation scarring noted in the medial right lung. No new or progressive findings to suggest recurrent disease. 2. Stable appearance of the small sclerotic lesions in the right iliac bone and L1 vertebral body.   Electronically Signed   By: EMisty StanleyM.D.   On: 01/07/2015 09:48   ASSESSMENT AND PLAN: This is a very pleasant 69years old African American male with metastatic non-small cell lung cancer, squamous cell carcinoma diagnosed in September 2008 status post systemic chemotherapy and has been observation since March of 2009 with no evidence for disease progression. His recent CT scan of the Chest, Abdomen and pelvis showed no  evidence for disease recurrence. I discussed the scan results with the patient and his wife. I recommended for him to continue on observation with repeat CT scan of the chest, abdomen and pelvis in one year. He was advised to call immediately if he has any concerning symptoms in the interval.  All questions were answered. The patient knows to call the clinic with any problems, questions or concerns. We can certainly see the patient much sooner if necessary.  Disclaimer: This note was dictated with voice recognition software. Similar sounding words can inadvertently be transcribed and may not be corrected upon review.

## 2015-01-14 NOTE — Telephone Encounter (Signed)
Added appt....kim printed gave pt sched and avs and gv barium

## 2015-01-14 NOTE — Telephone Encounter (Signed)
Pt confirmed labs/ov per 07/25 POF, gave pt AVS and Calendar..... KJ, gave pt barium.Marland KitchenMarland Kitchen

## 2015-01-18 ENCOUNTER — Other Ambulatory Visit: Payer: Self-pay | Admitting: Internal Medicine

## 2015-01-18 ENCOUNTER — Ambulatory Visit
Admission: RE | Admit: 2015-01-18 | Discharge: 2015-01-18 | Disposition: A | Discharge status: Home / Self Care | Payer: Medicare Other | Source: Ambulatory Visit | Attending: Internal Medicine | Admitting: Internal Medicine

## 2015-01-18 DIAGNOSIS — R0602 Shortness of breath: Secondary | ICD-10-CM

## 2015-01-21 ENCOUNTER — Telehealth (HOSPITAL_COMMUNITY): Payer: Self-pay | Admitting: Radiology

## 2016-01-06 ENCOUNTER — Encounter (HOSPITAL_COMMUNITY): Payer: Self-pay

## 2016-01-06 ENCOUNTER — Ambulatory Visit (HOSPITAL_COMMUNITY)
Admission: RE | Admit: 2016-01-06 | Discharge: 2016-01-06 | Disposition: A | Discharge status: Home / Self Care | Payer: Medicare Other | Source: Ambulatory Visit | Attending: Internal Medicine | Admitting: Internal Medicine

## 2016-01-06 ENCOUNTER — Other Ambulatory Visit (HOSPITAL_BASED_OUTPATIENT_CLINIC_OR_DEPARTMENT_OTHER): Payer: Medicare Other

## 2016-01-06 DIAGNOSIS — M899 Disorder of bone, unspecified: Secondary | ICD-10-CM | POA: Insufficient documentation

## 2016-01-06 DIAGNOSIS — C3490 Malignant neoplasm of unspecified part of unspecified bronchus or lung: Secondary | ICD-10-CM | POA: Diagnosis not present

## 2016-01-06 LAB — CBC WITH DIFFERENTIAL/PLATELET
BASO%: 0.4 % (ref 0.0–2.0)
Basophils Absolute: 0 10*3/uL (ref 0.0–0.1)
EOS%: 1.5 % (ref 0.0–7.0)
Eosinophils Absolute: 0.1 10*3/uL (ref 0.0–0.5)
HCT: 45.7 % (ref 38.4–49.9)
HGB: 15.3 g/dL (ref 13.0–17.1)
LYMPH%: 28.5 % (ref 14.0–49.0)
MCH: 31.7 pg (ref 27.2–33.4)
MCHC: 33.5 g/dL (ref 32.0–36.0)
MCV: 94.7 fL (ref 79.3–98.0)
MONO#: 0.4 10*3/uL (ref 0.1–0.9)
MONO%: 8 % (ref 0.0–14.0)
NEUT#: 3.1 10*3/uL (ref 1.5–6.5)
NEUT%: 61.6 % (ref 39.0–75.0)
Platelets: 244 10*3/uL (ref 140–400)
RBC: 4.82 10*6/uL (ref 4.20–5.82)
RDW: 13.5 % (ref 11.0–14.6)
WBC: 5.1 10*3/uL (ref 4.0–10.3)
lymph#: 1.4 10*3/uL (ref 0.9–3.3)

## 2016-01-06 LAB — COMPREHENSIVE METABOLIC PANEL
ALT: 20 U/L (ref 0–55)
AST: 22 U/L (ref 5–34)
Albumin: 3.7 g/dL (ref 3.5–5.0)
Alkaline Phosphatase: 72 U/L (ref 40–150)
Anion Gap: 10 mEq/L (ref 3–11)
BUN: 17.8 mg/dL (ref 7.0–26.0)
CO2: 23 mEq/L (ref 22–29)
Calcium: 9.1 mg/dL (ref 8.4–10.4)
Chloride: 104 mEq/L (ref 98–109)
Creatinine: 1.3 mg/dL (ref 0.7–1.3)
EGFR: 63 mL/min/{1.73_m2} — ABNORMAL LOW (ref >90)
Glucose: 96 mg/dl (ref 70–140)
Potassium: 4.1 mEq/L (ref 3.5–5.1)
Sodium: 138 mEq/L (ref 136–145)
Total Bilirubin: 0.68 mg/dL (ref 0.20–1.20)
Total Protein: 7.4 g/dL (ref 6.4–8.3)

## 2016-01-06 MED ORDER — IOPAMIDOL (ISOVUE-300) INJECTION 61%
100.0000 mL | Freq: Once | INTRAVENOUS | Status: AC | PRN
  Administered 2016-01-06: 100 mL via INTRAVENOUS

## 2016-01-13 ENCOUNTER — Telehealth: Payer: Self-pay | Admitting: Internal Medicine

## 2016-01-13 ENCOUNTER — Encounter: Payer: Self-pay | Admitting: Internal Medicine

## 2016-01-13 ENCOUNTER — Ambulatory Visit (HOSPITAL_BASED_OUTPATIENT_CLINIC_OR_DEPARTMENT_OTHER): Payer: Medicare Other | Admitting: Internal Medicine

## 2016-01-13 VITALS — BP 170/90 | HR 87 | Temp 98.4°F | Resp 19 | Ht 71.0 in | Wt 214.9 lb

## 2016-01-13 DIAGNOSIS — I10 Essential (primary) hypertension: Secondary | ICD-10-CM | POA: Diagnosis not present

## 2016-01-13 DIAGNOSIS — C3490 Malignant neoplasm of unspecified part of unspecified bronchus or lung: Secondary | ICD-10-CM

## 2016-01-13 DIAGNOSIS — C3411 Malignant neoplasm of upper lobe, right bronchus or lung: Secondary | ICD-10-CM

## 2016-01-13 NOTE — Progress Notes (Signed)
[place] [phone]   Fax:(336) [phone]  OFFICE PROGRESS NOTE  [name], NP [facility] [place] [address]  DIAGNOSIS: Metastatic non-small cell lung cancer, squamous cell carcinoma diagnosed in September 2008.   PRIOR THERAPY:  1. Status post right femoral fixation for impending fracture performed on May 12, 2007. 2. Status post palliative radiotherapy to the chest and right hip area under the care of Dr. Tammi Klippel between May 31, 2007, through June 20, 2007. 3. Status post 6 cycles of systemic chemotherapy with carboplatin and gemcitabine. Last dose was given September 13, 2007. 4. Status post 6 months' treatment with Coumadin for deep venous thrombosis of the right lower extremity that was diagnosed in March 2009.  CURRENT THERAPY: Observation.  INTERVAL HISTORY: Tommy Johnson 71 y.o. male returns to the clinic today for annual followup visit. The patient is feeling fine today with no specific complaints except for arthritis. He is currently taking Aleve and ibuprofen as needed for pain management. He was a little bit sad today after his wife left him and to took all the money. He denied having any significant chest pain, shortness of breath, cough or hemoptysis. The patient denied having any significant weight loss or night sweats. He forgot to take his blood pressure medication earlier today. He had repeat CT scan of the chest, abdomen and pelvis performed recently and he is here for evaluation and discussion of his scan results.  MEDICAL HISTORY: Past Medical History:  Diagnosis Date  . Hypertension   . lung ca dx'd 02/2007    ALLERGIES:  has No Known Allergies.  MEDICATIONS:  Current Outpatient Prescriptions  Medication Sig Dispense Refill  . b complex vitamins tablet Take 1 tablet by mouth daily.      . fish oil-omega-3 fatty acids 1000 MG capsule Take 2 g by mouth daily.      Marland Kitchen  levothyroxine (SYNTHROID, LEVOTHROID) 150 MCG tablet Take 150 mcg by mouth daily.    . Multiple Vitamin (MULTIVITAMIN) tablet Take 1 tablet by mouth daily.      Marland Kitchen NIFEdipine (PROCARDIA-XL/ADALAT CC) 30 MG 24 hr tablet      No current facility-administered medications for this visit.     REVIEW OF SYSTEMS:  A comprehensive review of systems was negative except for: Musculoskeletal: positive for arthralgias   PHYSICAL EXAMINATION: General appearance: alert, cooperative and no distress Head: Normocephalic, without obvious abnormality, atraumatic Neck: no adenopathy Lymph nodes: Cervical, supraclavicular, and axillary nodes normal. Resp: clear to auscultation bilaterally Cardio: regular rate and rhythm, S1, S2 normal, no murmur, click, rub or gallop GI: soft, non-tender; bowel sounds normal; no masses,  no organomegaly Extremities: extremities normal, atraumatic, no cyanosis or edema  ECOG PERFORMANCE STATUS: 0 - Asymptomatic  Blood pressure (!) 170/90, pulse 87, temperature 98.4 F (36.9 C), temperature source Oral, resp. rate 19, height '5\' 11"'$  (1.803 m), weight 97.5 kg (214 lb 14.4 oz), SpO2 100 %.  LABORATORY DATA: Lab Results  Component Value Date   WBC 5.1 01/06/2016   HGB 15.3 01/06/2016   HCT 45.7 01/06/2016   MCV 94.7 01/06/2016   PLT 244 01/06/2016      Chemistry      Component Value Date/Time   NA 138 01/06/2016 0845   K 4.1 01/06/2016 0845   CL 101 06/20/2012 0824   CO2 23 01/06/2016 0845   BUN 17.8 01/06/2016 0845   CREATININE 1.3 01/06/2016 0845  Component Value Date/Time   CALCIUM 9.1 01/06/2016 0845   ALKPHOS 72 01/06/2016 0845   AST 22 01/06/2016 0845   ALT 20 01/06/2016 0845   BILITOT 0.68 01/06/2016 0845       RADIOGRAPHIC STUDIES: Ct Chest W Contrast  Result Date: 01/06/2016 CLINICAL DATA:  Patient with history of lung carcinoma. Restaging exam. Patient status post chemotherapy and radiation. EXAM: CT CHEST, ABDOMEN AND PELVIS WITHOUT  CONTRAST TECHNIQUE: Multidetector CT imaging of the chest, abdomen and pelvis was performed following the standard protocol without IV contrast. COMPARISON:  CT CAP 01/07/2015. FINDINGS: CT CHEST FINDINGS Mediastinum/Lymph Nodes: Heart is normal in size. No axillary, mediastinal or hilar lymphadenopathy. Aorta main pulmonary artery normal in caliber. Coronary arterial vascular calcifications. Esophagus is unremarkable. No pericardial effusion. Lungs/Pleura: Central airways are patent. Stable post radiation fibrosis within the medial right lung. Stable 4 mm ground-glass nodules in the posterior right lung (image 48; series 5). No new or enlarging pulmonary nodules or masses. No pleural effusion or pneumothorax. Musculoskeletal: No aggressive or acute appearing osseous lesions. CT ABDOMEN PELVIS FINDINGS Hepatobiliary: Liver is normal in size and contour. No focal hepatic lesion is identified. Gallbladder is unremarkable. Pancreas: Unremarkable Spleen: Unremarkable Adrenals/Urinary Tract: The adrenal glands are normal. Kidneys enhance symmetrically with contrast. No hydronephrosis. No suspicious enhancing renal masses are identified. Urinary bladder is unremarkable. Tiny bilateral renal stones. Stomach/Bowel: No abnormal bowel wall thickening or evidence for bowel obstruction. The appendix is normal. No free fluid or free intraperitoneal air. Normal morphology of the stomach. Vascular/Lymphatic: Normal caliber abdominal aorta. Peripheral calcified atherosclerotic plaque. No retroperitoneal lymphadenopathy. Reproductive: Prostate is enlarged. Other: Stable appearing bilateral fat containing inguinal hernias. Musculoskeletal: Stable small sclerotic lesion within the right iliac bone, adjacent to the SI joint. Additionally small 9 mm sclerotic lesion within the left aspect of the L1 vertebral body is stable. IMPRESSION: Stable post radiation changes within the medial right lung. No new or progressive fine extends suggest  recurrent disease. Stable small sclerotic lesions within the right iliac bone and L1 vertebral body. Electronically Signed   By: Lovey Newcomer M.D.   On: 01/06/2016 10:20   Ct Abdomen Pelvis W Contrast  Result Date: 01/06/2016 CLINICAL DATA:  Patient with history of lung carcinoma. Restaging exam. Patient status post chemotherapy and radiation. EXAM: CT CHEST, ABDOMEN AND PELVIS WITHOUT CONTRAST TECHNIQUE: Multidetector CT imaging of the chest, abdomen and pelvis was performed following the standard protocol without IV contrast. COMPARISON:  CT CAP 01/07/2015. FINDINGS: CT CHEST FINDINGS Mediastinum/Lymph Nodes: Heart is normal in size. No axillary, mediastinal or hilar lymphadenopathy. Aorta main pulmonary artery normal in caliber. Coronary arterial vascular calcifications. Esophagus is unremarkable. No pericardial effusion. Lungs/Pleura: Central airways are patent. Stable post radiation fibrosis within the medial right lung. Stable 4 mm ground-glass nodules in the posterior right lung (image 48; series 5). No new or enlarging pulmonary nodules or masses. No pleural effusion or pneumothorax. Musculoskeletal: No aggressive or acute appearing osseous lesions. CT ABDOMEN PELVIS FINDINGS Hepatobiliary: Liver is normal in size and contour. No focal hepatic lesion is identified. Gallbladder is unremarkable. Pancreas: Unremarkable Spleen: Unremarkable Adrenals/Urinary Tract: The adrenal glands are normal. Kidneys enhance symmetrically with contrast. No hydronephrosis. No suspicious enhancing renal masses are identified. Urinary bladder is unremarkable. Tiny bilateral renal stones. Stomach/Bowel: No abnormal bowel wall thickening or evidence for bowel obstruction. The appendix is normal. No free fluid or free intraperitoneal air. Normal morphology of the stomach. Vascular/Lymphatic: Normal caliber abdominal aorta. Peripheral calcified atherosclerotic plaque. No  retroperitoneal lymphadenopathy. Reproductive: Prostate is  enlarged. Other: Stable appearing bilateral fat containing inguinal hernias. Musculoskeletal: Stable small sclerotic lesion within the right iliac bone, adjacent to the SI joint. Additionally small 9 mm sclerotic lesion within the left aspect of the L1 vertebral body is stable. IMPRESSION: Stable post radiation changes within the medial right lung. No new or progressive fine extends suggest recurrent disease. Stable small sclerotic lesions within the right iliac bone and L1 vertebral body. Electronically Signed   By: Lovey Newcomer M.D.   On: 01/06/2016 10:20   ASSESSMENT AND PLAN: This is a very pleasant 69 years old [place] American male with metastatic non-small cell lung cancer, squamous cell carcinoma diagnosed in September 2008 status post systemic chemotherapy and has been observation since March of 2009 with no evidence for disease progression. His recent CT scan of the Chest, Abdomen and pelvis showed no evidence for disease recurrence. I discussed the scan results with the patient. I recommended for him to continue on observation with repeat CT scan of the chest, abdomen and pelvis in one year. For hypertension, I strongly advised the patient to take his blood pressure medication as prescribed. I also asked him to contact his primary care physician for evaluation and adjustment of his medication if persist to be high at home. He was advised to call immediately if he has any concerning symptoms in the interval.  All questions were answered. The patient knows to call the clinic with any problems, questions or concerns. We can certainly see the patient much sooner if necessary.  Disclaimer: This note was dictated with voice recognition software. Similar sounding words can inadvertently be transcribed and may not be corrected upon review.

## 2016-01-13 NOTE — Telephone Encounter (Signed)
Gave patient avs report and appointments for July 2018. Radiology scheduling will call re scan - patient aware.

## 2016-02-26 ENCOUNTER — Other Ambulatory Visit: Payer: Self-pay | Admitting: Geriatric Medicine

## 2016-02-26 DIAGNOSIS — R1031 Right lower quadrant pain: Secondary | ICD-10-CM

## 2016-02-28 ENCOUNTER — Ambulatory Visit
Admission: RE | Admit: 2016-02-28 | Discharge: 2016-02-28 | Disposition: A | Discharge status: Home / Self Care | Payer: Medicare Other | Source: Ambulatory Visit | Attending: Geriatric Medicine | Admitting: Geriatric Medicine

## 2016-02-28 DIAGNOSIS — R1031 Right lower quadrant pain: Secondary | ICD-10-CM

## 2016-02-28 MED ORDER — IOPAMIDOL (ISOVUE-300) INJECTION 61%
125.0000 mL | Freq: Once | INTRAVENOUS | Status: AC | PRN
  Administered 2016-02-28: 125 mL via INTRAVENOUS

## 2016-05-13 ENCOUNTER — Ambulatory Visit
Admission: RE | Admit: 2016-05-13 | Discharge: 2016-05-13 | Disposition: A | Discharge status: Home / Self Care | Payer: Medicare Other | Source: Ambulatory Visit | Attending: Internal Medicine | Admitting: Internal Medicine

## 2016-05-13 ENCOUNTER — Other Ambulatory Visit: Payer: Self-pay | Admitting: Internal Medicine

## 2016-05-13 DIAGNOSIS — M25512 Pain in left shoulder: Secondary | ICD-10-CM

## 2017-01-04 ENCOUNTER — Encounter (HOSPITAL_COMMUNITY): Payer: Self-pay

## 2017-01-04 ENCOUNTER — Ambulatory Visit (HOSPITAL_COMMUNITY)
Admission: RE | Admit: 2017-01-04 | Discharge: 2017-01-04 | Disposition: A | Discharge status: Home / Self Care | Payer: Medicare Other | Source: Ambulatory Visit | Attending: Internal Medicine | Admitting: Internal Medicine

## 2017-01-04 ENCOUNTER — Other Ambulatory Visit (HOSPITAL_BASED_OUTPATIENT_CLINIC_OR_DEPARTMENT_OTHER): Payer: Medicare Other

## 2017-01-04 DIAGNOSIS — C3411 Malignant neoplasm of upper lobe, right bronchus or lung: Secondary | ICD-10-CM

## 2017-01-04 DIAGNOSIS — C3490 Malignant neoplasm of unspecified part of unspecified bronchus or lung: Secondary | ICD-10-CM

## 2017-01-04 DIAGNOSIS — J439 Emphysema, unspecified: Secondary | ICD-10-CM | POA: Insufficient documentation

## 2017-01-04 LAB — CBC WITH DIFFERENTIAL/PLATELET
BASO%: 0.3 % (ref 0.0–2.0)
Basophils Absolute: 0 10*3/uL (ref 0.0–0.1)
EOS%: 2.5 % (ref 0.0–7.0)
Eosinophils Absolute: 0.2 10*3/uL (ref 0.0–0.5)
HCT: 46.8 % (ref 38.4–49.9)
HGB: 15.7 g/dL (ref 13.0–17.1)
LYMPH%: 29.8 % (ref 14.0–49.0)
MCH: 32.2 pg (ref 27.2–33.4)
MCHC: 33.5 g/dL (ref 32.0–36.0)
MCV: 96.1 fL (ref 79.3–98.0)
MONO#: 0.5 10*3/uL (ref 0.1–0.9)
MONO%: 7.7 % (ref 0.0–14.0)
NEUT#: 4.1 10*3/uL (ref 1.5–6.5)
NEUT%: 59.7 % (ref 39.0–75.0)
Platelets: 231 10*3/uL (ref 140–400)
RBC: 4.87 10*6/uL (ref 4.20–5.82)
RDW: 13.6 % (ref 11.0–14.6)
WBC: 6.8 10*3/uL (ref 4.0–10.3)
lymph#: 2 10*3/uL (ref 0.9–3.3)

## 2017-01-04 LAB — COMPREHENSIVE METABOLIC PANEL
ALT: 14 U/L (ref 0–55)
AST: 18 U/L (ref 5–34)
Albumin: 3.3 g/dL — ABNORMAL LOW (ref 3.5–5.0)
Alkaline Phosphatase: 71 U/L (ref 40–150)
Anion Gap: 11 mEq/L (ref 3–11)
BUN: 15.1 mg/dL (ref 7.0–26.0)
CO2: 22 mEq/L (ref 22–29)
Calcium: 9.1 mg/dL (ref 8.4–10.4)
Chloride: 107 mEq/L (ref 98–109)
Creatinine: 1.5 mg/dL — ABNORMAL HIGH (ref 0.7–1.3)
EGFR: 55 mL/min/{1.73_m2} — ABNORMAL LOW (ref >90)
Glucose: 102 mg/dl (ref 70–140)
Potassium: 3.9 mEq/L (ref 3.5–5.1)
Sodium: 141 mEq/L (ref 136–145)
Total Bilirubin: 0.85 mg/dL (ref 0.20–1.20)
Total Protein: 6.8 g/dL (ref 6.4–8.3)

## 2017-01-04 MED ORDER — IOPAMIDOL (ISOVUE-300) INJECTION 61%
100.0000 mL | Freq: Once | INTRAVENOUS | Status: AC | PRN
  Administered 2017-01-04: 100 mL via INTRAVENOUS

## 2017-01-04 MED ORDER — IOPAMIDOL (ISOVUE-300) INJECTION 61%
INTRAVENOUS | Status: AC
  Filled 2017-01-04: qty 100

## 2017-01-11 ENCOUNTER — Telehealth: Payer: Self-pay | Admitting: Internal Medicine

## 2017-01-11 ENCOUNTER — Ambulatory Visit (HOSPITAL_BASED_OUTPATIENT_CLINIC_OR_DEPARTMENT_OTHER): Payer: Medicare Other | Admitting: Internal Medicine

## 2017-01-11 VITALS — BP 128/84 | HR 86 | Temp 98.9°F | Resp 18 | Ht 71.0 in | Wt 219.8 lb

## 2017-01-11 DIAGNOSIS — C3490 Malignant neoplasm of unspecified part of unspecified bronchus or lung: Secondary | ICD-10-CM | POA: Diagnosis not present

## 2017-01-11 DIAGNOSIS — I10 Essential (primary) hypertension: Secondary | ICD-10-CM | POA: Insufficient documentation

## 2017-01-11 DIAGNOSIS — C3411 Malignant neoplasm of upper lobe, right bronchus or lung: Secondary | ICD-10-CM

## 2017-01-11 MED ORDER — CLONIDINE HCL 0.1 MG PO TABS
0.2000 mg | ORAL_TABLET | Freq: Once | ORAL | Status: AC
  Administered 2017-01-11: 0.2 mg via ORAL

## 2017-01-11 MED ORDER — CLONIDINE HCL 0.1 MG PO TABS
ORAL_TABLET | ORAL | Status: AC
  Filled 2017-01-11: qty 2

## 2017-01-11 NOTE — Telephone Encounter (Signed)
Scheduled appt per 7/23 los - Gave patient AVS and calender per los. [facility] to contact patient with ct schedule.

## 2017-01-11 NOTE — Progress Notes (Signed)
[facility] Telephone:([phone]) [phone]   Fax:([phone]) [phone]  OFFICE PROGRESS NOTE  Everardo Beals, NP [facility] [address] [place] [address]  DIAGNOSIS: Metastatic non-small cell lung cancer, squamous cell carcinoma diagnosed in September 2008.   PRIOR THERAPY:  1. Status post right femoral fixation for impending fracture performed on May 12, 2007. 2. Status post palliative radiotherapy to the chest and right hip area under the care of Dr. Tammi Klippel between May 31, 2007, through June 20, 2007. 3. Status post 6 cycles of systemic chemotherapy with carboplatin and gemcitabine. Last dose was given September 13, 2007. 4. Status post 6 months' treatment with Coumadin for deep venous thrombosis of the right lower extremity that was diagnosed in March 2009.  CURRENT THERAPY: Observation.  INTERVAL HISTORY: DAYRON ODLAND 71 y.o. male returns to the clinic today for follow-up visit. The patient is feeling fine today was no specific complaints. He denied having any chest pain, shortness of breath, cough or hemoptysis. He denied having any fever or chills. He has no nausea, vomiting, diarrhea or constipation. He has no weight loss or night sweats. She had repeat CT scan of the chest, abdomen and pelvis performed recently and he is here for evaluation and discussion of his scan results.  MEDICAL HISTORY: Past Medical History:  Diagnosis Date  . Hypertension   . lung ca dx'd 02/2007    ALLERGIES:  has No Known Allergies.  MEDICATIONS:  Current Outpatient Prescriptions  Medication Sig Dispense Refill  . b complex vitamins tablet Take 1 tablet by mouth daily.      . fish oil-omega-3 fatty acids 1000 MG capsule Take 2 g by mouth daily.      Marland Kitchen levothyroxine (SYNTHROID, LEVOTHROID) 150 MCG tablet Take 150 mcg by mouth daily.    . Multiple Vitamin (MULTIVITAMIN) tablet Take 1 tablet by mouth daily.      Marland Kitchen NIFEdipine (PROCARDIA-XL/ADALAT  CC) 30 MG 24 hr tablet      No current facility-administered medications for this visit.     REVIEW OF SYSTEMS:  Constitutional: negative Eyes: negative Ears, nose, mouth, throat, and face: negative Respiratory: negative Cardiovascular: negative Gastrointestinal: negative Genitourinary:negative Integument/breast: negative Hematologic/lymphatic: negative Musculoskeletal:positive for arthralgias Neurological: negative Behavioral/Psych: negative Endocrine: negative Allergic/Immunologic: negative   PHYSICAL EXAMINATION: General appearance: alert, cooperative and no distress Head: Normocephalic, without obvious abnormality, atraumatic Neck: no adenopathy Lymph nodes: Cervical, supraclavicular, and axillary nodes normal. Resp: clear to auscultation bilaterally Back: symmetric, no curvature. ROM normal. No CVA tenderness. Cardio: regular rate and rhythm, S1, S2 normal, no murmur, click, rub or gallop GI: soft, non-tender; bowel sounds normal; no masses,  no organomegaly Extremities: extremities normal, atraumatic, no cyanosis or edema Neurologic: Alert and oriented X 3, normal strength and tone. Normal symmetric reflexes. Normal coordination and gait  ECOG PERFORMANCE STATUS: 0 - Asymptomatic  Blood pressure (!) 189/95, pulse 86, temperature 98.9 F (37.2 C), temperature source Oral, resp. rate 18, height 5\' 11"  (1.803 m), weight 219 lb 12.8 oz (99.7 kg), SpO2 100 %.  LABORATORY DATA: Lab Results  Component Value Date   WBC 6.8 01/04/2017   HGB 15.7 01/04/2017   HCT 46.8 01/04/2017   MCV 96.1 01/04/2017   PLT 231 01/04/2017      Chemistry      Component Value Date/Time   NA 141 01/04/2017 0809   K 3.9 01/04/2017 0809   CL 101 06/20/2012 0824   CO2 22 01/04/2017 0809  BUN 15.1 01/04/2017 0809   CREATININE 1.5 (H) 01/04/2017 0809      Component Value Date/Time   CALCIUM 9.1 01/04/2017 0809   ALKPHOS 71 01/04/2017 0809   AST 18 01/04/2017 0809   ALT 14 01/04/2017  0809   BILITOT 0.85 01/04/2017 0809       RADIOGRAPHIC STUDIES: Ct Chest W Contrast  Result Date: 01/04/2017 CLINICAL DATA:  Restaging lung cancer. EXAM: CT CHEST, ABDOMEN, AND PELVIS WITH CONTRAST TECHNIQUE: Multidetector CT imaging of the chest, abdomen and pelvis was performed following the standard protocol during bolus administration of intravenous contrast. CONTRAST:  163mL ISOVUE-300 IOPAMIDOL (ISOVUE-300) INJECTION 61% COMPARISON:  01/06/2016 FINDINGS: CT CHEST FINDINGS Cardiovascular: The heart is normal in size. No pericardial effusion. The aorta is normal in caliber. Stable atherosclerotic calcifications. No dissection. Stable coronary artery calcifications. Mediastinum/Nodes: No mediastinal or hilar mass or lymphadenopathy. The esophagus is grossly normal. Lungs/Pleura: Stable radiation changes involving the right paramediastinal lung but no findings for recurrent tumor. The lungs are clear of an acute process. No evidence of pulmonary metastatic disease. No pleural effusions. Musculoskeletal: The chest wall is unremarkable. No chest wall mass. No supraclavicular or axillary lymphadenopathy. The thyroid gland is normal. The bony structures are unremarkable. No findings suspicious for osseous metastatic disease. CT ABDOMEN PELVIS FINDINGS Hepatobiliary: No focal hepatic lesions or intrahepatic biliary dilatation. The gallbladder is unremarkable. No common bile duct dilatation. Pancreas: No mass, inflammation or ductal dilatation. Spleen: Normal size.  No focal lesions. Adrenals/Urinary Tract: The adrenal glands are normal. Stable mild nodularity. Chronic renal scarring changes. Lower pole left renal calculus. No obstructing ureteral calculi or bladder calculi. Stomach/Bowel: The stomach, duodenum, small bowel and colon are grossly normal. No acute inflammatory changes, mass lesions or obstructive findings. The terminal ileum and appendix are normal. Vascular/Lymphatic: Advanced atherosclerotic  calcifications involving the aorta and iliac arteries. No focal aneurysm or dissection. The branch vessels patent. The major venous structures are patent. No mesenteric or retroperitoneal mass or adenopathy. Small scattered lymph nodes are stable. Reproductive: The prostate gland is moderately enlarged. The seminal vesicles appear normal. Other: Stable thick g right inguinal hernia containing fat. Possible prior surgical changes. No inguinal adenopathy. Musculoskeletal: No significant bony findings. No worrisome bone lesions. Remote posttraumatic changes involving the right hip. IMPRESSION: 1. Stable radiation changes involving the right paramediastinal lung and mediastinum but no findings for recurrent tumor or metastatic disease. 2. Emphysematous changes but no acute pulmonary findings. 3. No findings for abdominal/pelvic metastatic disease. Electronically Signed   By: Marijo Sanes M.D.   On: 01/04/2017 16:30   Ct Abdomen Pelvis W Contrast  Result Date: 01/04/2017 CLINICAL DATA:  Restaging lung cancer. EXAM: CT CHEST, ABDOMEN, AND PELVIS WITH CONTRAST TECHNIQUE: Multidetector CT imaging of the chest, abdomen and pelvis was performed following the standard protocol during bolus administration of intravenous contrast. CONTRAST:  160mL ISOVUE-300 IOPAMIDOL (ISOVUE-300) INJECTION 61% COMPARISON:  01/06/2016 FINDINGS: CT CHEST FINDINGS Cardiovascular: The heart is normal in size. No pericardial effusion. The aorta is normal in caliber. Stable atherosclerotic calcifications. No dissection. Stable coronary artery calcifications. Mediastinum/Nodes: No mediastinal or hilar mass or lymphadenopathy. The esophagus is grossly normal. Lungs/Pleura: Stable radiation changes involving the right paramediastinal lung but no findings for recurrent tumor. The lungs are clear of an acute process. No evidence of pulmonary metastatic disease. No pleural effusions. Musculoskeletal: The chest wall is unremarkable. No chest wall mass.  No supraclavicular or axillary lymphadenopathy. The thyroid gland is normal. The bony structures are unremarkable. No  findings suspicious for osseous metastatic disease. CT ABDOMEN PELVIS FINDINGS Hepatobiliary: No focal hepatic lesions or intrahepatic biliary dilatation. The gallbladder is unremarkable. No common bile duct dilatation. Pancreas: No mass, inflammation or ductal dilatation. Spleen: Normal size.  No focal lesions. Adrenals/Urinary Tract: The adrenal glands are normal. Stable mild nodularity. Chronic renal scarring changes. Lower pole left renal calculus. No obstructing ureteral calculi or bladder calculi. Stomach/Bowel: The stomach, duodenum, small bowel and colon are grossly normal. No acute inflammatory changes, mass lesions or obstructive findings. The terminal ileum and appendix are normal. Vascular/Lymphatic: Advanced atherosclerotic calcifications involving the aorta and iliac arteries. No focal aneurysm or dissection. The branch vessels patent. The major venous structures are patent. No mesenteric or retroperitoneal mass or adenopathy. Small scattered lymph nodes are stable. Reproductive: The prostate gland is moderately enlarged. The seminal vesicles appear normal. Other: Stable thick g right inguinal hernia containing fat. Possible prior surgical changes. No inguinal adenopathy. Musculoskeletal: No significant bony findings. No worrisome bone lesions. Remote posttraumatic changes involving the right hip. IMPRESSION: 1. Stable radiation changes involving the right paramediastinal lung and mediastinum but no findings for recurrent tumor or metastatic disease. 2. Emphysematous changes but no acute pulmonary findings. 3. No findings for abdominal/pelvic metastatic disease. Electronically Signed   By: Marijo Sanes M.D.   On: 01/04/2017 16:30   ASSESSMENT AND PLAN:  This is a very pleasant 71 years old African-American male with metastatic non-small cell lung cancer, squamous cell carcinoma  diagnosed in September 2008. The patient is status post systemic chemotherapy and has been observation since March 2009. He had repeat CT scan of the chest, abdomen and pelvis performed recently. His scan showed no concerning findings for disease recurrence or progression. I discussed the scan results with the patient today and recommended for him to continue on observation with repeat CT scan of the chest, abdomen and pelvis in one year. For hypertension, I strongly recommend for the patient to take his blood pressure medication as prescribed. I will also give the patient a dose of clonidine 0.2 mg by mouth 1 today. He was advised to consult with his primary care physician for adjustment of his medication. The patient was advised to call immediately if he has any concerning symptoms in the interval. All questions were answered. The patient knows to call the clinic with any problems, questions or concerns. We can certainly see the patient much sooner if necessary.  Disclaimer: This note was dictated with voice recognition software. Similar sounding words can inadvertently be transcribed and may not be corrected upon review.

## 2018-01-06 ENCOUNTER — Ambulatory Visit (HOSPITAL_COMMUNITY)
Admission: RE | Admit: 2018-01-06 | Discharge: 2018-01-06 | Disposition: A | Discharge status: Home / Self Care | Payer: Medicare Other | Source: Ambulatory Visit | Attending: Internal Medicine | Admitting: Internal Medicine

## 2018-01-06 ENCOUNTER — Encounter (HOSPITAL_COMMUNITY): Payer: Self-pay

## 2018-01-06 ENCOUNTER — Inpatient Hospital Stay: Payer: Medicare Other | Attending: Internal Medicine

## 2018-01-06 DIAGNOSIS — I10 Essential (primary) hypertension: Secondary | ICD-10-CM | POA: Diagnosis present

## 2018-01-06 DIAGNOSIS — Z7901 Long term (current) use of anticoagulants: Secondary | ICD-10-CM | POA: Insufficient documentation

## 2018-01-06 DIAGNOSIS — Z923 Personal history of irradiation: Secondary | ICD-10-CM | POA: Diagnosis not present

## 2018-01-06 DIAGNOSIS — I700 Atherosclerosis of aorta: Secondary | ICD-10-CM | POA: Insufficient documentation

## 2018-01-06 DIAGNOSIS — C7951 Secondary malignant neoplasm of bone: Secondary | ICD-10-CM | POA: Insufficient documentation

## 2018-01-06 DIAGNOSIS — C3411 Malignant neoplasm of upper lobe, right bronchus or lung: Secondary | ICD-10-CM

## 2018-01-06 DIAGNOSIS — Z79899 Other long term (current) drug therapy: Secondary | ICD-10-CM | POA: Insufficient documentation

## 2018-01-06 DIAGNOSIS — N400 Enlarged prostate without lower urinary tract symptoms: Secondary | ICD-10-CM | POA: Diagnosis not present

## 2018-01-06 DIAGNOSIS — C3490 Malignant neoplasm of unspecified part of unspecified bronchus or lung: Secondary | ICD-10-CM | POA: Insufficient documentation

## 2018-01-06 DIAGNOSIS — J439 Emphysema, unspecified: Secondary | ICD-10-CM | POA: Insufficient documentation

## 2018-01-06 DIAGNOSIS — R634 Abnormal weight loss: Secondary | ICD-10-CM | POA: Insufficient documentation

## 2018-01-06 DIAGNOSIS — I2510 Atherosclerotic heart disease of native coronary artery without angina pectoris: Secondary | ICD-10-CM | POA: Diagnosis not present

## 2018-01-06 DIAGNOSIS — N200 Calculus of kidney: Secondary | ICD-10-CM | POA: Insufficient documentation

## 2018-01-06 DIAGNOSIS — Z9221 Personal history of antineoplastic chemotherapy: Secondary | ICD-10-CM | POA: Diagnosis not present

## 2018-01-06 DIAGNOSIS — Z86718 Personal history of other venous thrombosis and embolism: Secondary | ICD-10-CM | POA: Insufficient documentation

## 2018-01-06 LAB — CBC WITH DIFFERENTIAL/PLATELET
Basophils Absolute: 0 10*3/uL (ref 0.0–0.1)
Basophils Relative: 1 %
Eosinophils Absolute: 0.2 10*3/uL (ref 0.0–0.5)
Eosinophils Relative: 4 %
HCT: 46.8 % (ref 38.4–49.9)
Hemoglobin: 15.6 g/dL (ref 13.0–17.1)
Lymphocytes Relative: 27 %
Lymphs Abs: 1.7 10*3/uL (ref 0.9–3.3)
MCH: 31.9 pg (ref 27.2–33.4)
MCHC: 33.3 g/dL (ref 32.0–36.0)
MCV: 95.6 fL (ref 79.3–98.0)
Monocytes Absolute: 0.5 10*3/uL (ref 0.1–0.9)
Monocytes Relative: 8 %
Neutro Abs: 3.7 10*3/uL (ref 1.5–6.5)
Neutrophils Relative %: 60 %
Platelets: 249 10*3/uL (ref 140–400)
RBC: 4.9 MIL/uL (ref 4.20–5.82)
RDW: 13.9 % (ref 11.0–14.6)
WBC: 6.1 10*3/uL (ref 4.0–10.3)

## 2018-01-06 LAB — COMPREHENSIVE METABOLIC PANEL
ALT: 16 U/L (ref 0–44)
AST: 16 U/L (ref 15–41)
Albumin: 3.2 g/dL — ABNORMAL LOW (ref 3.5–5.0)
Alkaline Phosphatase: 70 U/L (ref 38–126)
Anion gap: 7 (ref 5–15)
BUN: 20 mg/dL (ref 8–23)
CO2: 26 mmol/L (ref 22–32)
Calcium: 8.8 mg/dL — ABNORMAL LOW (ref 8.9–10.3)
Chloride: 105 mmol/L (ref 98–111)
Creatinine, Ser: 1.38 mg/dL — ABNORMAL HIGH (ref 0.61–1.24)
GFR calc Af Amer: 57 mL/min — ABNORMAL LOW (ref >60)
GFR calc non Af Amer: 49 mL/min — ABNORMAL LOW (ref >60)
Glucose, Bld: 97 mg/dL (ref 70–99)
Potassium: 4.2 mmol/L (ref 3.5–5.1)
Sodium: 138 mmol/L (ref 135–145)
Total Bilirubin: 0.3 mg/dL (ref 0.3–1.2)
Total Protein: 6.3 g/dL — ABNORMAL LOW (ref 6.5–8.1)

## 2018-01-06 MED ORDER — IOPAMIDOL (ISOVUE-300) INJECTION 61%
INTRAVENOUS | Status: AC
  Filled 2018-01-06: qty 100

## 2018-01-06 MED ORDER — IOPAMIDOL (ISOVUE-300) INJECTION 61%
100.0000 mL | Freq: Once | INTRAVENOUS | Status: AC | PRN
  Administered 2018-01-06: 100 mL via INTRAVENOUS

## 2018-01-07 ENCOUNTER — Telehealth: Payer: Self-pay | Admitting: Internal Medicine

## 2018-01-07 NOTE — Telephone Encounter (Signed)
Patient r/s upcoming July appts due to being out of town for original appts.

## 2018-01-10 ENCOUNTER — Encounter: Payer: Self-pay | Admitting: Oncology

## 2018-01-10 ENCOUNTER — Inpatient Hospital Stay (HOSPITAL_BASED_OUTPATIENT_CLINIC_OR_DEPARTMENT_OTHER): Payer: Medicare Other | Admitting: Oncology

## 2018-01-10 VITALS — BP 165/94 | HR 93 | Temp 98.5°F | Resp 18 | Ht 71.0 in | Wt 211.5 lb

## 2018-01-10 DIAGNOSIS — Z923 Personal history of irradiation: Secondary | ICD-10-CM

## 2018-01-10 DIAGNOSIS — C7951 Secondary malignant neoplasm of bone: Secondary | ICD-10-CM | POA: Diagnosis not present

## 2018-01-10 DIAGNOSIS — C3490 Malignant neoplasm of unspecified part of unspecified bronchus or lung: Secondary | ICD-10-CM | POA: Diagnosis not present

## 2018-01-10 DIAGNOSIS — I10 Essential (primary) hypertension: Secondary | ICD-10-CM

## 2018-01-10 DIAGNOSIS — Z9221 Personal history of antineoplastic chemotherapy: Secondary | ICD-10-CM

## 2018-01-10 DIAGNOSIS — Z86718 Personal history of other venous thrombosis and embolism: Secondary | ICD-10-CM

## 2018-01-10 DIAGNOSIS — R634 Abnormal weight loss: Secondary | ICD-10-CM

## 2018-01-10 DIAGNOSIS — Z7901 Long term (current) use of anticoagulants: Secondary | ICD-10-CM

## 2018-01-10 DIAGNOSIS — C3411 Malignant neoplasm of upper lobe, right bronchus or lung: Secondary | ICD-10-CM

## 2018-01-10 DIAGNOSIS — Z79899 Other long term (current) drug therapy: Secondary | ICD-10-CM

## 2018-01-10 NOTE — Progress Notes (Signed)
[place] OFFICE PROGRESS NOTE  Leeroy Cha, MD [address] [place] [address]  DIAGNOSIS: Metastatic non-small cell lung cancer, squamous cell carcinoma diagnosed in September 2008.   PRIOR THERAPY:  1. Status post right femoral fixation for impending fracture performed on May 12, 2007. 2. Status post palliative radiotherapy to the chest and right hip area under the care of Dr. Tammi Klippel between May 31, 2007, through June 20, 2007. 3. Status post 6 cycles of systemic chemotherapy with carboplatin and gemcitabine. Last dose was given September 13, 2007. 4. Status post 6 months' treatment with Coumadin for deep venous thrombosis of the right lower extremity that was diagnosed in March 2009.  CURRENT THERAPY: Observation.  INTERVAL HISTORY: Tommy Johnson 72 y.o. male returns for routine follow-up visit by himself.  The patient is feeling fine today and has no specific complaints.  The patient denies fevers and chills.  Denies chest pain, shortness of breath, cough, hemoptysis.  Denies nausea, vomiting, constipation, diarrhea.  The patient has lost weight but he reports a good appetite.  He thinks his weight loss is related to working outside in the heat.  Denies night sweats.  The patient had a CT of the chest, abdomen, pelvis performed recently and is here to discuss the results.  MEDICAL HISTORY: Past Medical History:  Diagnosis Date  . Hypertension   . lung ca dx'd 02/2007    ALLERGIES:  has No Known Allergies.  MEDICATIONS:  Current Outpatient Medications  Medication Sig Dispense Refill  . b complex vitamins tablet Take 1 tablet by mouth daily.      . fish oil-omega-3 fatty acids 1000 MG capsule Take 2 g by mouth daily.      Marland Kitchen levothyroxine (SYNTHROID, LEVOTHROID) 150 MCG tablet Take 150 mcg by mouth daily.    . Multiple Vitamin (MULTIVITAMIN) tablet Take 1 tablet by mouth daily.      Marland Kitchen NIFEdipine (PROCARDIA-XL/ADALAT CC) 30  MG 24 hr tablet      No current facility-administered medications for this visit.     SURGICAL HISTORY: History reviewed. No pertinent surgical history.  REVIEW OF SYSTEMS:   Review of Systems  Constitutional: Negative for appetite change, chills, fatigue, fever and unexpected weight change.  Positive for weight loss.  HENT:   Negative for mouth sores, nosebleeds, sore throat and trouble swallowing.   Eyes: Negative for eye problems and icterus.  Respiratory: Negative for cough, hemoptysis, shortness of breath and wheezing.   Cardiovascular: Negative for chest pain and leg swelling.  Gastrointestinal: Negative for abdominal pain, constipation, diarrhea, nausea and vomiting.  Genitourinary: Negative for bladder incontinence, difficulty urinating, dysuria, frequency and hematuria.   Musculoskeletal: Negative for back pain, gait problem, neck pain and neck stiffness.  Skin: Negative for itching and rash.  Neurological: Negative for dizziness, extremity weakness, gait problem, headaches, light-headedness and seizures.  Hematological: Negative for adenopathy. Does not bruise/bleed easily.  Psychiatric/Behavioral: Negative for confusion, depression and sleep disturbance. The patient is not nervous/anxious.     PHYSICAL EXAMINATION:  Blood pressure (!) 165/94, pulse 93, temperature 98.5 F (36.9 C), temperature source Oral, resp. rate 18, height 5\' 11"  (2.992 m), weight 211 lb 8 oz (95.9 kg), SpO2 99 %.  ECOG PERFORMANCE STATUS: 1 - Symptomatic but completely ambulatory  Physical Exam  Constitutional: Oriented to person, place, and time and well-developed, well-nourished, and in no distress. No distress.  HENT:  Head: Normocephalic and atraumatic.  Mouth/Throat: Oropharynx is clear and moist.  No oropharyngeal exudate.  Eyes: Conjunctivae are normal. Right eye exhibits no discharge. Left eye exhibits no discharge. No scleral icterus.  Neck: Normal range of motion. Neck supple.   Cardiovascular: Normal rate, regular rhythm, normal heart sounds and intact distal pulses.   Pulmonary/Chest: Effort normal and breath sounds normal. No respiratory distress. No wheezes. No rales.  Abdominal: Soft. Bowel sounds are normal. Exhibits no distension and no mass. There is no tenderness.  Musculoskeletal: Normal range of motion. Exhibits no edema.  Lymphadenopathy:    No cervical adenopathy.  Neurological: Alert and oriented to person, place, and time. Exhibits normal muscle tone. Gait normal. Coordination normal.  Skin: Skin is warm and dry. No rash noted. Not diaphoretic. No erythema. No pallor.  Psychiatric: Mood, memory and judgment normal.  Vitals reviewed.  LABORATORY DATA: Lab Results  Component Value Date   WBC 6.1 01/06/2018   HGB 15.6 01/06/2018   HCT 46.8 01/06/2018   MCV 95.6 01/06/2018   PLT 249 01/06/2018      Chemistry      Component Value Date/Time   NA 138 01/06/2018 0759   NA 141 01/04/2017 0809   K 4.2 01/06/2018 0759   K 3.9 01/04/2017 0809   CL 105 01/06/2018 0759   CL 101 06/20/2012 0824   CO2 26 01/06/2018 0759   CO2 22 01/04/2017 0809   BUN 20 01/06/2018 0759   BUN 15.1 01/04/2017 0809   CREATININE 1.38 (H) 01/06/2018 0759   CREATININE 1.5 (H) 01/04/2017 0809      Component Value Date/Time   CALCIUM 8.8 (L) 01/06/2018 0759   CALCIUM 9.1 01/04/2017 0809   ALKPHOS 70 01/06/2018 0759   ALKPHOS 71 01/04/2017 0809   AST 16 01/06/2018 0759   AST 18 01/04/2017 0809   ALT 16 01/06/2018 0759   ALT 14 01/04/2017 0809   BILITOT 0.3 01/06/2018 0759   BILITOT 0.85 01/04/2017 0809       RADIOGRAPHIC STUDIES:  Ct Chest W Contrast  Result Date: 01/06/2018 CLINICAL DATA:  Lung cancer diagnosed in 2008 with chemotherapy and radiation therapy completed in 2009. Right inguinal hernia repair. EXAM: CT CHEST, ABDOMEN, AND PELVIS WITH CONTRAST TECHNIQUE: Multidetector CT imaging of the chest, abdomen and pelvis was performed following the standard  protocol during bolus administration of intravenous contrast. CONTRAST:  153mL ISOVUE-300 IOPAMIDOL (ISOVUE-300) INJECTION 61% COMPARISON:  01/04/2017 FINDINGS: CT CHEST FINDINGS Cardiovascular: Aortic and branch vessel atherosclerosis. Normal heart size with minimal anterior pericardial fluid, similar. Multivessel coronary artery atherosclerosis. No central pulmonary embolism, on this non-dedicated study. Mediastinum/Nodes: No supraclavicular adenopathy. No mediastinal or hilar adenopathy. Lungs/Pleura: No pleural fluid.  Patent airways. Mild centrilobular emphysema. Paramediastinal right upper and right lower lobe radiation induced fibrosis is similar in configuration on the prior. No evidence of locally recurrent disease. Musculoskeletal: Right chest wall intramuscular lipoma 4.0 cm, unchanged. No acute osseous abnormality. CT ABDOMEN PELVIS FINDINGS Hepatobiliary: Normal liver. Normal gallbladder, without biliary ductal dilatation. Pancreas: Normal, without mass or ductal dilatation. Spleen: Normal in size, without focal abnormality. Adrenals/Urinary Tract: Mild left adrenal thickening. Normal right adrenal gland. Bilateral punctate renal collecting system calculi. No hydronephrosis. Normal urinary bladder. Stomach/Bowel: Proximal gastric underdistention. Apparent wall thickening within the fundus and body is likely secondary. Example 63/2. Normal terminal ileum and appendix. Normal small bowel. Vascular/Lymphatic: Aortic and branch vessel atherosclerosis. No abdominopelvic adenopathy. Reproductive: Moderate prostatomegaly. Other: No significant free fluid. Left inguinal hernia contains fat. A right inguinal hernia contains fat and either fluid or soft tissue  thickening, similar. No evidence of omental or peritoneal disease. Musculoskeletal: Right proximal femoral fixation. Lumbosacral spondylosis with grade 1 L2-3 anterolisthesis. IMPRESSION: 1. Similar right paramediastinal radiation fibrosis, without  recurrent or metastatic disease. 2. Aortic atherosclerosis (ICD10-I70.0), coronary artery atherosclerosis and emphysema (ICD10-J43.9). 3. Bilateral nephrolithiasis. 4. Prostatomegaly. 5. Proximal gastric underdistention. Apparent gastric wall thickening is at least partially secondary. Correlate with any symptoms to suggest gastritis or other gastric pathology. Electronically Signed   By: Abigail Miyamoto M.D.   On: 01/06/2018 13:39   Ct Abdomen Pelvis W Contrast  Result Date: 01/06/2018 CLINICAL DATA:  Lung cancer diagnosed in 2008 with chemotherapy and radiation therapy completed in 2009. Right inguinal hernia repair. EXAM: CT CHEST, ABDOMEN, AND PELVIS WITH CONTRAST TECHNIQUE: Multidetector CT imaging of the chest, abdomen and pelvis was performed following the standard protocol during bolus administration of intravenous contrast. CONTRAST:  148mL ISOVUE-300 IOPAMIDOL (ISOVUE-300) INJECTION 61% COMPARISON:  01/04/2017 FINDINGS: CT CHEST FINDINGS Cardiovascular: Aortic and branch vessel atherosclerosis. Normal heart size with minimal anterior pericardial fluid, similar. Multivessel coronary artery atherosclerosis. No central pulmonary embolism, on this non-dedicated study. Mediastinum/Nodes: No supraclavicular adenopathy. No mediastinal or hilar adenopathy. Lungs/Pleura: No pleural fluid.  Patent airways. Mild centrilobular emphysema. Paramediastinal right upper and right lower lobe radiation induced fibrosis is similar in configuration on the prior. No evidence of locally recurrent disease. Musculoskeletal: Right chest wall intramuscular lipoma 4.0 cm, unchanged. No acute osseous abnormality. CT ABDOMEN PELVIS FINDINGS Hepatobiliary: Normal liver. Normal gallbladder, without biliary ductal dilatation. Pancreas: Normal, without mass or ductal dilatation. Spleen: Normal in size, without focal abnormality. Adrenals/Urinary Tract: Mild left adrenal thickening. Normal right adrenal gland. Bilateral punctate renal  collecting system calculi. No hydronephrosis. Normal urinary bladder. Stomach/Bowel: Proximal gastric underdistention. Apparent wall thickening within the fundus and body is likely secondary. Example 63/2. Normal terminal ileum and appendix. Normal small bowel. Vascular/Lymphatic: Aortic and branch vessel atherosclerosis. No abdominopelvic adenopathy. Reproductive: Moderate prostatomegaly. Other: No significant free fluid. Left inguinal hernia contains fat. A right inguinal hernia contains fat and either fluid or soft tissue thickening, similar. No evidence of omental or peritoneal disease. Musculoskeletal: Right proximal femoral fixation. Lumbosacral spondylosis with grade 1 L2-3 anterolisthesis. IMPRESSION: 1. Similar right paramediastinal radiation fibrosis, without recurrent or metastatic disease. 2. Aortic atherosclerosis (ICD10-I70.0), coronary artery atherosclerosis and emphysema (ICD10-J43.9). 3. Bilateral nephrolithiasis. 4. Prostatomegaly. 5. Proximal gastric underdistention. Apparent gastric wall thickening is at least partially secondary. Correlate with any symptoms to suggest gastritis or other gastric pathology. Electronically Signed   By: Abigail Miyamoto M.D.   On: 01/06/2018 13:39     ASSESSMENT/PLAN:  Primary cancer of right upper lobe of lung [facility]) This is a very pleasant 72 year old African-American male with metastatic non-small cell lung cancer, squamous cell carcinoma diagnosed in September 2008. The patient is status post systemic chemotherapy and has been observation since March 2009. He had repeat CT scan of the chest, abdomen and pelvis performed recently and is here to discuss the results.  The patient was seen with Dr. Julien Nordmann. His scan showed no concerning findings for disease recurrence or progression. Recommend for him to continue observation.  He will have a restaging CT scan of the chest, abdomen, pelvis in 1 year and follow-up 1 to 2 days after the CT scan to discuss the  results.  For hypertension, I strongly recommend for the patient to take his blood pressure medication as prescribed.  He will follow-up with his primary care provider for titration of his blood pressure medication.  The patient was advised to call immediately if he has any concerning symptoms in the interval. All questions were answered. The patient knows to call the clinic with any problems, questions or concerns. We can certainly see the patient much sooner if necessary.   Orders Placed This Encounter  Procedures  . CT ABDOMEN PELVIS W CONTRAST    Standing Status:   Future    Standing Expiration Date:   01/11/2019    Order Specific Question:   If indicated for the ordered procedure, I authorize the administration of contrast media per Radiology protocol    Answer:   Yes    Order Specific Question:   Preferred imaging location?    Answer:   [facility]    Order Specific Question:   Radiology Contrast Protocol - do NOT remove file path    Answer:   \\[email or web address]    Order Specific Question:   ** REASON FOR EXAM (FREE TEXT)    Answer:   Lung cancer. Restaging.  . CT CHEST W CONTRAST    Standing Status:   Future    Standing Expiration Date:   01/11/2019    Order Specific Question:   If indicated for the ordered procedure, I authorize the administration of contrast media per Radiology protocol    Answer:   Yes    Order Specific Question:   Preferred imaging location?    Answer:   [facility]    Order Specific Question:   Radiology Contrast Protocol - do NOT remove file path    Answer:   \\[email or web address]    Order Specific Question:   ** REASON FOR EXAM (FREE TEXT)    Answer:   Lung cancer. Restaging.  Marland Kitchen CBC with Differential (Cancer Center Only)    Standing Status:   Future    Standing Expiration Date:   01/11/2019  . CMP ([facility] only)    Standing Status:   Future    Standing Expiration Date:   01/11/2019    Mikey Bussing, DNP, AGPCNP-BC, AOCNP 01/10/18  ADDENDUM: Hematology/Oncology Attending: I had a face-to-face encounter with the patient today.  I recommended his care plan.  This is a very pleasant 72 years old African-American male with metastatic non-small cell lung cancer, squamous cell carcinoma status post systemic chemotherapy completed in March 2009 he has been in observation since that time.  The patient is feeling fine today with no concerning complaints.  Is here today for annual follow-up visit and repeat CT scan of the chest, abdomen and pelvis.  I personally and independently reviewed the scans and discussed the results with the patient today. The scan showed no concerning findings for disease progression. I recommended for the patient to continue on observation with repeat CT scan of the chest, abdomen and pelvis in 1 year. He was advised to call immediately if he has any concerning symptoms in the interval. Disclaimer: This note was dictated with voice recognition software. Similar sounding words can inadvertently be transcribed and may be missed upon review. Eilleen Kempf, MD 01/10/18

## 2018-01-10 NOTE — Assessment & Plan Note (Addendum)
This is a very pleasant 72 year old African-American male with metastatic non-small cell lung cancer, squamous cell carcinoma diagnosed in September 2008. The patient is status post systemic chemotherapy and has been observation since March 2009. He had repeat CT scan of the chest, abdomen and pelvis performed recently and is here to discuss the results.  The patient was seen with Dr. Julien Nordmann. His scan showed no concerning findings for disease recurrence or progression. Recommend for him to continue observation.  He will have a restaging CT scan of the chest, abdomen, pelvis in 1 year and follow-up 1 to 2 days after the CT scan to discuss the results.  For hypertension, I strongly recommend for the patient to take his blood pressure medication as prescribed.  He will follow-up with his primary care provider for titration of his blood pressure medication.  The patient was advised to call immediately if he has any concerning symptoms in the interval. All questions were answered. The patient knows to call the clinic with any problems, questions or concerns. We can certainly see the patient much sooner if necessary.

## 2018-01-11 ENCOUNTER — Telehealth: Payer: Self-pay | Admitting: Internal Medicine

## 2018-01-11 NOTE — Telephone Encounter (Signed)
Scheduled appt per 7/22 los - sent reminder letter in the mail with appt date and time.

## 2018-01-13 ENCOUNTER — Ambulatory Visit: Payer: Medicare Other | Admitting: Internal Medicine

## 2018-10-21 ENCOUNTER — Other Ambulatory Visit: Payer: Self-pay | Admitting: Urology

## 2018-10-21 DIAGNOSIS — R972 Elevated prostate specific antigen [PSA]: Secondary | ICD-10-CM

## 2018-11-16 ENCOUNTER — Ambulatory Visit
Admission: RE | Admit: 2018-11-16 | Discharge: 2018-11-16 | Disposition: A | Discharge status: Home / Self Care | Payer: Medicare Other | Source: Ambulatory Visit | Attending: Urology | Admitting: Urology

## 2018-11-16 ENCOUNTER — Other Ambulatory Visit: Payer: Self-pay

## 2018-11-16 DIAGNOSIS — R972 Elevated prostate specific antigen [PSA]: Secondary | ICD-10-CM

## 2018-11-16 MED ORDER — GADOBENATE DIMEGLUMINE 529 MG/ML IV SOLN
20.0000 mL | Freq: Once | INTRAVENOUS | Status: AC | PRN
  Administered 2018-11-16: 20 mL via INTRAVENOUS

## 2019-01-05 ENCOUNTER — Ambulatory Visit (HOSPITAL_COMMUNITY): Payer: Medicare Other

## 2019-01-05 ENCOUNTER — Other Ambulatory Visit: Payer: Medicare Other

## 2019-01-09 ENCOUNTER — Ambulatory Visit: Payer: Medicare Other | Admitting: Internal Medicine

## 2019-01-09 ENCOUNTER — Other Ambulatory Visit: Payer: Medicare Other

## 2019-08-10 ENCOUNTER — Ambulatory Visit: Payer: Medicare Other

## 2019-08-14 ENCOUNTER — Ambulatory Visit: Payer: Medicare Other | Attending: Family

## 2019-08-14 DIAGNOSIS — Z23 Encounter for immunization: Secondary | ICD-10-CM | POA: Insufficient documentation

## 2019-08-14 NOTE — Progress Notes (Signed)
   Covid-19 Vaccination Clinic  Name:  Tommy Johnson    MRN: [id number] DOB: January 28, 1946  08/14/2019  [name] was observed post Covid-19 immunization for 15 minutes without incidence. He was provided with Vaccine Information Sheet and instruction to access the V-Safe system.   [name] was instructed to call 911 with any severe reactions post vaccine: Marland Kitchen Difficulty breathing  . Swelling of your face and throat  . A fast heartbeat  . A bad rash all over your body  . Dizziness and weakness    Immunizations Administered    Name Date Dose VIS Date Route   Moderna COVID-19 Vaccine 08/14/2019  2:02 PM 0.5 mL 05/23/2019 Intramuscular   Manufacturer: Moderna   Lot: [id number]   [place]: [id number]

## 2019-09-19 ENCOUNTER — Ambulatory Visit: Payer: Medicare Other | Attending: Family

## 2019-09-19 DIAGNOSIS — Z23 Encounter for immunization: Secondary | ICD-10-CM

## 2019-09-19 NOTE — Progress Notes (Signed)
   Covid-19 Vaccination Clinic  Name:  Tommy Johnson    MRN: [id number] DOB: [birth year]  09/19/2019  Tommy Johnson was observed post Covid-19 immunization for 15 minutes without incident. He was provided with Vaccine Information Sheet and instruction to access the V-Safe system.   Tommy Johnson was instructed to call 911 with any severe reactions post vaccine: Marland Kitchen Difficulty breathing  . Swelling of face and throat  . A fast heartbeat  . A bad rash all over body  . Dizziness and weakness   Immunizations Administered    Name Date Dose VIS Date Route   Moderna COVID-19 Vaccine 09/19/2019 12:29 PM 0.5 mL 05/23/2019 Intramuscular   Manufacturer: Moderna   Lot: [id number]   [place]: [id number]

## 2020-01-16 ENCOUNTER — Other Ambulatory Visit (HOSPITAL_COMMUNITY): Payer: Self-pay | Admitting: Internal Medicine

## 2020-01-16 DIAGNOSIS — M7989 Other specified soft tissue disorders: Secondary | ICD-10-CM

## 2020-01-17 ENCOUNTER — Other Ambulatory Visit: Payer: Self-pay

## 2020-01-17 ENCOUNTER — Ambulatory Visit (HOSPITAL_COMMUNITY)
Admission: RE | Admit: 2020-01-17 | Discharge: 2020-01-17 | Disposition: A | Discharge status: Home / Self Care | Payer: Medicare Other | Source: Ambulatory Visit | Attending: Internal Medicine | Admitting: Internal Medicine

## 2020-01-17 DIAGNOSIS — M7989 Other specified soft tissue disorders: Secondary | ICD-10-CM | POA: Insufficient documentation

## 2020-01-17 NOTE — Progress Notes (Signed)
Right lower extremity venous duplex has been completed. Preliminary results can be found in CV Proc through chart review.  Results were given to [place].  01/17/20 10:01 AM Tommy Johnson RVT

## 2020-09-23 ENCOUNTER — Other Ambulatory Visit: Payer: Self-pay | Admitting: Internal Medicine

## 2020-09-23 DIAGNOSIS — R6889 Other general symptoms and signs: Secondary | ICD-10-CM

## 2020-09-25 ENCOUNTER — Ambulatory Visit
Admission: RE | Admit: 2020-09-25 | Discharge: 2020-09-25 | Disposition: A | Discharge status: Home / Self Care | Payer: Medicare Other | Source: Ambulatory Visit | Attending: Internal Medicine | Admitting: Internal Medicine

## 2020-09-25 DIAGNOSIS — R6889 Other general symptoms and signs: Secondary | ICD-10-CM

## 2020-09-25 DIAGNOSIS — I10 Essential (primary) hypertension: Secondary | ICD-10-CM | POA: Diagnosis not present

## 2020-09-26 ENCOUNTER — Other Ambulatory Visit: Payer: Self-pay | Admitting: Physician Assistant

## 2020-09-26 ENCOUNTER — Ambulatory Visit
Admission: RE | Admit: 2020-09-26 | Discharge: 2020-09-26 | Disposition: A | Discharge status: Home / Self Care | Payer: Medicare Other | Source: Ambulatory Visit | Attending: Physician Assistant | Admitting: Physician Assistant

## 2020-09-26 DIAGNOSIS — R0609 Other forms of dyspnea: Secondary | ICD-10-CM | POA: Diagnosis not present

## 2020-09-26 DIAGNOSIS — R0600 Dyspnea, unspecified: Secondary | ICD-10-CM | POA: Diagnosis not present

## 2020-09-26 DIAGNOSIS — I10 Essential (primary) hypertension: Secondary | ICD-10-CM | POA: Diagnosis not present

## 2020-09-26 DIAGNOSIS — Z85118 Personal history of other malignant neoplasm of bronchus and lung: Secondary | ICD-10-CM

## 2020-09-30 DIAGNOSIS — E785 Hyperlipidemia, unspecified: Secondary | ICD-10-CM | POA: Diagnosis not present

## 2020-09-30 DIAGNOSIS — Z0000 Encounter for general adult medical examination without abnormal findings: Secondary | ICD-10-CM | POA: Diagnosis not present

## 2020-09-30 DIAGNOSIS — Z7189 Other specified counseling: Secondary | ICD-10-CM | POA: Diagnosis not present

## 2020-09-30 DIAGNOSIS — N289 Disorder of kidney and ureter, unspecified: Secondary | ICD-10-CM | POA: Diagnosis not present

## 2020-09-30 DIAGNOSIS — R0600 Dyspnea, unspecified: Secondary | ICD-10-CM | POA: Diagnosis not present

## 2020-09-30 DIAGNOSIS — R6889 Other general symptoms and signs: Secondary | ICD-10-CM | POA: Diagnosis not present

## 2020-09-30 DIAGNOSIS — Z1389 Encounter for screening for other disorder: Secondary | ICD-10-CM | POA: Diagnosis not present

## 2020-09-30 DIAGNOSIS — Z85118 Personal history of other malignant neoplasm of bronchus and lung: Secondary | ICD-10-CM | POA: Diagnosis not present

## 2020-09-30 DIAGNOSIS — I10 Essential (primary) hypertension: Secondary | ICD-10-CM | POA: Diagnosis not present

## 2020-10-01 ENCOUNTER — Other Ambulatory Visit: Payer: Self-pay | Admitting: Internal Medicine

## 2020-10-01 DIAGNOSIS — R6889 Other general symptoms and signs: Secondary | ICD-10-CM

## 2020-10-15 ENCOUNTER — Ambulatory Visit
Admission: RE | Admit: 2020-10-15 | Discharge: 2020-10-15 | Disposition: A | Discharge status: Home / Self Care | Payer: Medicare Other | Source: Ambulatory Visit | Attending: Internal Medicine | Admitting: Internal Medicine

## 2020-10-15 ENCOUNTER — Other Ambulatory Visit: Payer: Self-pay

## 2020-10-15 DIAGNOSIS — I10 Essential (primary) hypertension: Secondary | ICD-10-CM | POA: Diagnosis not present

## 2020-10-15 DIAGNOSIS — I739 Peripheral vascular disease, unspecified: Secondary | ICD-10-CM | POA: Diagnosis not present

## 2020-10-15 DIAGNOSIS — R6889 Other general symptoms and signs: Secondary | ICD-10-CM

## 2020-10-15 MED ORDER — IOPAMIDOL (ISOVUE-370) INJECTION 76%
125.0000 mL | Freq: Once | INTRAVENOUS | Status: AC | PRN
  Administered 2020-10-15: 125 mL via INTRAVENOUS

## 2020-10-25 ENCOUNTER — Ambulatory Visit: Payer: Medicare Other | Admitting: Vascular Surgery

## 2020-10-25 ENCOUNTER — Other Ambulatory Visit: Payer: Self-pay

## 2020-10-25 ENCOUNTER — Encounter: Payer: Self-pay | Admitting: Vascular Surgery

## 2020-10-25 DIAGNOSIS — I739 Peripheral vascular disease, unspecified: Secondary | ICD-10-CM

## 2020-10-25 NOTE — Progress Notes (Signed)
Patient name: Tommy Johnson MRN: [id number] DOB: 02/14/1946 Sex: male  REASON FOR CONSULT: Evaluate PAD and possible lower extremity claudication  HPI: Tommy Johnson is a 75 y.o. male, with history of hypertension and lung cancer that presents for evaluation of PAD and possible claudication.  Patient states his main complaint is bilateral knee pain that has been ongoing for 6 months.  He states his knees tend to ache throughout the day and get worse with more activity.  He has no pain or burning in the calf.  He has no pain in the foot.  He has no tissue loss at this time or other nonhealing wounds.  States has had his right femur replaced due to lung cancer that metastasized in the past.  Quit smoking in 1980s.  His PCP ordered lower extremity vascular studies on 09/25/2020 that showed ABI on the right of 0.9 and ABI on the left of 0.94.  Toe pressures were 129 on the right and 142 on the left.  He then got a CTA on 10/15/2020.  Ultimately the CTA showed no flow-limiting stenosis in the aortoiliac segment.  He had plaque in the bilateral femoral-popliteal segments with only peroneal runoff bilaterally.  Past Medical History:  Diagnosis Date  . Hypertension   . lung ca dx'd 02/2007    No past surgical history on file.  No family history on file.  SOCIAL HISTORY: Social History   Socioeconomic History  . Marital status: Single    Spouse name: Not on file  . Number of children: Not on file  . Years of education: Not on file  . Highest education level: Not on file  Occupational History  . Not on file  Tobacco Use  . Smoking status: Former Smoker    Types: Cigarettes    Quit date: 07/23/1982    Years since quitting: 38.2  . Smokeless tobacco: Never Used  Substance and Sexual Activity  . Alcohol use: Yes    Comment: 3 drinks this past year  . Drug use: Never  . Sexual activity: Not on file  Other Topics Concern  . Not on file  Social History Narrative  . Not on file   Social  Determinants of Health   Financial Resource Strain: Not on file  Food Insecurity: Not on file  Transportation Needs: Not on file  Physical Activity: Not on file  Stress: Not on file  Social Connections: Not on file  Intimate Partner Violence: Not on file    Allergies  Allergen Reactions  . Ramipril Other (See Comments)    Current Outpatient Medications  Medication Sig Dispense Refill  . albuterol (VENTOLIN HFA) 108 (90 Base) MCG/ACT inhaler 2 puffs as needed    . amLODipine (NORVASC) 5 MG tablet Take 1 tablet by mouth daily.    Marland Kitchen atorvastatin (LIPITOR) 40 MG tablet Take 1 tablet by mouth daily.    Marland Kitchen b complex vitamins tablet Take 1 tablet by mouth daily.    . fish oil-omega-3 fatty acids 1000 MG capsule Take 2 g by mouth daily.    Marland Kitchen levothyroxine (SYNTHROID, LEVOTHROID) 150 MCG tablet Take 150 mcg by mouth daily.    Marland Kitchen losartan (COZAAR) 50 MG tablet Take 1 tablet by mouth daily.    . Multiple Vitamin (MULTIVITAMIN) tablet Take 1 tablet by mouth daily.    Marland Kitchen NIFEdipine (PROCARDIA-XL/ADALAT CC) 30 MG 24 hr tablet      No current facility-administered medications for this visit.    REVIEW  OF SYSTEMS:  [X]  denotes positive finding, [ ]  denotes negative finding Cardiac  Comments:  Chest pain or chest pressure:    Shortness of breath upon exertion:    Short of breath when lying flat:    Irregular heart rhythm:        Vascular    Pain in calf, thigh, or hip brought on by ambulation:    Pain in feet at night that wakes you up from your sleep:     Blood clot in your veins:    Leg swelling:         Pulmonary    Oxygen at home:    Productive cough:     Wheezing:         Neurologic    Sudden weakness in arms or legs:     Sudden numbness in arms or legs:     Sudden onset of difficulty speaking or slurred speech:    Temporary loss of vision in one eye:     Problems with dizziness:         Gastrointestinal    Blood in stool:     Vomited blood:         Genitourinary     Burning when urinating:     Blood in urine:        Psychiatric    Major depression:         Hematologic    Bleeding problems:    Problems with blood clotting too easily:        Skin    Rashes or ulcers:        Constitutional    Fever or chills:      PHYSICAL EXAM: Vitals:   10/25/20 0843  BP: (!) 180/94  Pulse: 68  Resp: 16  Temp: 97.9 F (36.6 C)  TempSrc: Temporal  SpO2: 96%  Weight: 216 lb (98 kg)  Height: 5\' 11"  (1.803 m)    GENERAL: The patient is a well-nourished male, in no acute distress. The vital signs are documented above. CARDIAC: There is a regular rate and rhythm.  VASCULAR:  Palpable femoral pulses bilaterally Palpable popliteal pulses bilateral Palpable dorsalis pedis pulses bilaterally No lower extremity tissue loss PULMONARY: No respiratory distress. ABDOMEN: Soft and non-tender. MUSCULOSKELETAL: There are no major deformities or cyanosis. NEUROLOGIC: No focal weakness or paresthesias are detected. SKIN: There are no ulcers or rashes noted. PSYCHIATRIC: The patient has a normal affect.  DATA:   ABIs 09/25/20 were 0.9 on the right and 0.94 on the left  I reviewed CTA aortobifem from 10/15/2020 and agree he has atherosclerotic disease but no flow-limiting stenosis in the aortoiliac segment.  He has plaque through bilateral common femoral SFA popliteal segments that does not appear flow-limiting.  Tibial disease bilaterally with peroneal runoff  Assessment/Plan:  75 year old male presents for evaluation of PAD and possible underlying claudication.  I discussed with him in detail that I do not think his symptoms are consistent with PAD given his main complaint is bilateral knee pain that sounds more consistent with arthritis or some other orthopedic etiology.  He has no calf cramping when walking, no thigh cramping, no pain in the foot, and no tissue loss.  He has palpable common femoral, popliteal and dorsalis pedis pulses bilateral lower extremity  with a normal exam.  He does have bilateral tibial disease on his CTA with runoff but again appears to have patent peroneal runoff bilaterally with palpable pedal pulse on exam.  He can follow-up with  [place] as needed.  I offered a referral to orthopedic surgery for further evaluation of other etiologies of his knee pain but he wants to discuss with his PCP.   Marty Heck, MD [facility] Office: [phone]

## 2020-12-26 DIAGNOSIS — R3912 Poor urinary stream: Secondary | ICD-10-CM | POA: Diagnosis not present

## 2020-12-30 ENCOUNTER — Ambulatory Visit
Admission: RE | Admit: 2020-12-30 | Discharge: 2020-12-30 | Disposition: A | Discharge status: Home / Self Care | Payer: Medicare Other | Source: Ambulatory Visit | Attending: Internal Medicine | Admitting: Internal Medicine

## 2020-12-30 ENCOUNTER — Other Ambulatory Visit: Payer: Self-pay | Admitting: Internal Medicine

## 2020-12-30 DIAGNOSIS — E785 Hyperlipidemia, unspecified: Secondary | ICD-10-CM | POA: Diagnosis not present

## 2020-12-30 DIAGNOSIS — I739 Peripheral vascular disease, unspecified: Secondary | ICD-10-CM | POA: Diagnosis not present

## 2020-12-30 DIAGNOSIS — M19011 Primary osteoarthritis, right shoulder: Secondary | ICD-10-CM | POA: Diagnosis not present

## 2020-12-30 DIAGNOSIS — M25511 Pain in right shoulder: Secondary | ICD-10-CM

## 2020-12-30 DIAGNOSIS — N289 Disorder of kidney and ureter, unspecified: Secondary | ICD-10-CM | POA: Diagnosis not present

## 2020-12-30 DIAGNOSIS — I10 Essential (primary) hypertension: Secondary | ICD-10-CM | POA: Diagnosis not present

## 2020-12-30 DIAGNOSIS — Z85118 Personal history of other malignant neoplasm of bronchus and lung: Secondary | ICD-10-CM | POA: Diagnosis not present

## 2020-12-31 ENCOUNTER — Other Ambulatory Visit: Payer: Self-pay | Admitting: Urology

## 2020-12-31 DIAGNOSIS — R972 Elevated prostate specific antigen [PSA]: Secondary | ICD-10-CM

## 2021-01-06 DIAGNOSIS — M25512 Pain in left shoulder: Secondary | ICD-10-CM | POA: Diagnosis not present

## 2021-01-06 DIAGNOSIS — M25511 Pain in right shoulder: Secondary | ICD-10-CM | POA: Diagnosis not present

## 2021-01-24 ENCOUNTER — Ambulatory Visit
Admission: RE | Admit: 2021-01-24 | Discharge: 2021-01-24 | Disposition: A | Discharge status: Home / Self Care | Payer: Medicare Other | Source: Ambulatory Visit | Attending: Urology | Admitting: Urology

## 2021-01-24 DIAGNOSIS — K4020 Bilateral inguinal hernia, without obstruction or gangrene, not specified as recurrent: Secondary | ICD-10-CM | POA: Diagnosis not present

## 2021-01-24 DIAGNOSIS — R972 Elevated prostate specific antigen [PSA]: Secondary | ICD-10-CM

## 2021-01-24 MED ORDER — GADOBENATE DIMEGLUMINE 529 MG/ML IV SOLN
20.0000 mL | Freq: Once | INTRAVENOUS | Status: AC | PRN
  Administered 2021-01-24: 20 mL via INTRAVENOUS

## 2021-02-17 DIAGNOSIS — M25512 Pain in left shoulder: Secondary | ICD-10-CM | POA: Diagnosis not present

## 2021-02-17 DIAGNOSIS — M25511 Pain in right shoulder: Secondary | ICD-10-CM | POA: Diagnosis not present

## 2021-03-03 DIAGNOSIS — R3912 Poor urinary stream: Secondary | ICD-10-CM | POA: Diagnosis not present

## 2021-03-31 DIAGNOSIS — M545 Low back pain, unspecified: Secondary | ICD-10-CM | POA: Diagnosis not present

## 2021-03-31 DIAGNOSIS — Z85118 Personal history of other malignant neoplasm of bronchus and lung: Secondary | ICD-10-CM | POA: Diagnosis not present

## 2021-03-31 DIAGNOSIS — I10 Essential (primary) hypertension: Secondary | ICD-10-CM | POA: Diagnosis not present

## 2021-03-31 DIAGNOSIS — N289 Disorder of kidney and ureter, unspecified: Secondary | ICD-10-CM | POA: Diagnosis not present

## 2021-03-31 DIAGNOSIS — Z23 Encounter for immunization: Secondary | ICD-10-CM | POA: Diagnosis not present

## 2021-09-01 DIAGNOSIS — R3912 Poor urinary stream: Secondary | ICD-10-CM | POA: Diagnosis not present

## 2021-09-30 DIAGNOSIS — I10 Essential (primary) hypertension: Secondary | ICD-10-CM | POA: Diagnosis not present

## 2021-09-30 DIAGNOSIS — E785 Hyperlipidemia, unspecified: Secondary | ICD-10-CM | POA: Diagnosis not present

## 2021-09-30 DIAGNOSIS — E039 Hypothyroidism, unspecified: Secondary | ICD-10-CM | POA: Diagnosis not present

## 2021-09-30 DIAGNOSIS — Z0000 Encounter for general adult medical examination without abnormal findings: Secondary | ICD-10-CM | POA: Diagnosis not present

## 2021-09-30 DIAGNOSIS — I739 Peripheral vascular disease, unspecified: Secondary | ICD-10-CM | POA: Diagnosis not present

## 2021-09-30 DIAGNOSIS — M12811 Other specific arthropathies, not elsewhere classified, right shoulder: Secondary | ICD-10-CM | POA: Diagnosis not present

## 2021-09-30 DIAGNOSIS — H9191 Unspecified hearing loss, right ear: Secondary | ICD-10-CM | POA: Diagnosis not present

## 2021-09-30 DIAGNOSIS — Z1389 Encounter for screening for other disorder: Secondary | ICD-10-CM | POA: Diagnosis not present

## 2021-09-30 DIAGNOSIS — R739 Hyperglycemia, unspecified: Secondary | ICD-10-CM | POA: Diagnosis not present

## 2021-10-14 DIAGNOSIS — M12811 Other specific arthropathies, not elsewhere classified, right shoulder: Secondary | ICD-10-CM | POA: Diagnosis not present

## 2022-04-15 DIAGNOSIS — N289 Disorder of kidney and ureter, unspecified: Secondary | ICD-10-CM | POA: Diagnosis not present

## 2022-04-15 DIAGNOSIS — E039 Hypothyroidism, unspecified: Secondary | ICD-10-CM | POA: Diagnosis not present

## 2022-04-15 DIAGNOSIS — M25511 Pain in right shoulder: Secondary | ICD-10-CM | POA: Diagnosis not present

## 2022-04-15 DIAGNOSIS — Z23 Encounter for immunization: Secondary | ICD-10-CM | POA: Diagnosis not present

## 2022-04-15 DIAGNOSIS — I10 Essential (primary) hypertension: Secondary | ICD-10-CM | POA: Diagnosis not present

## 2022-04-22 DIAGNOSIS — E039 Hypothyroidism, unspecified: Secondary | ICD-10-CM | POA: Diagnosis not present

## 2022-04-22 DIAGNOSIS — I10 Essential (primary) hypertension: Secondary | ICD-10-CM | POA: Diagnosis not present

## 2022-09-24 DIAGNOSIS — R3912 Poor urinary stream: Secondary | ICD-10-CM | POA: Diagnosis not present

## 2022-10-13 DIAGNOSIS — H9191 Unspecified hearing loss, right ear: Secondary | ICD-10-CM | POA: Diagnosis not present

## 2022-10-13 DIAGNOSIS — N1831 Chronic kidney disease, stage 3a: Secondary | ICD-10-CM | POA: Diagnosis not present

## 2022-10-13 DIAGNOSIS — E785 Hyperlipidemia, unspecified: Secondary | ICD-10-CM | POA: Diagnosis not present

## 2022-10-13 DIAGNOSIS — Z0001 Encounter for general adult medical examination with abnormal findings: Secondary | ICD-10-CM | POA: Diagnosis not present

## 2022-10-13 DIAGNOSIS — M12811 Other specific arthropathies, not elsewhere classified, right shoulder: Secondary | ICD-10-CM | POA: Diagnosis not present

## 2022-10-13 DIAGNOSIS — G8929 Other chronic pain: Secondary | ICD-10-CM | POA: Diagnosis not present

## 2022-10-13 DIAGNOSIS — Z9181 History of falling: Secondary | ICD-10-CM | POA: Diagnosis not present

## 2022-10-13 DIAGNOSIS — I10 Essential (primary) hypertension: Secondary | ICD-10-CM | POA: Diagnosis not present

## 2022-10-13 DIAGNOSIS — I739 Peripheral vascular disease, unspecified: Secondary | ICD-10-CM | POA: Diagnosis not present

## 2022-10-13 DIAGNOSIS — Z0000 Encounter for general adult medical examination without abnormal findings: Secondary | ICD-10-CM | POA: Diagnosis not present

## 2022-10-13 DIAGNOSIS — E039 Hypothyroidism, unspecified: Secondary | ICD-10-CM | POA: Diagnosis not present

## 2022-10-30 ENCOUNTER — Telehealth: Payer: Self-pay

## 2022-10-30 DIAGNOSIS — N529 Male erectile dysfunction, unspecified: Secondary | ICD-10-CM

## 2022-10-30 NOTE — Telephone Encounter (Signed)
Orders Placed This Encounter  Procedures   Testosterone    Standing Status:   Future    Standing Expiration Date:   05/02/2023   Testosterone Total,Free,Bio, Males    Standing Status:   Future    Standing Expiration Date:   05/02/2023   Testosterone Free with SHBG    Standing Status:   Future    Standing Expiration Date:   05/02/2023   PSA    Standing Status:   Future    Standing Expiration Date:   05/02/2023   CBC    Standing Status:   Future    Standing Expiration Date:   05/02/2023   Prolactin    Standing Status:   Future    Standing Expiration Date:   05/02/2023

## 2022-11-05 ENCOUNTER — Other Ambulatory Visit: Payer: Medicare Other

## 2022-11-11 ENCOUNTER — Ambulatory Visit: Payer: Medicare Other | Admitting: "Endocrinology

## 2022-11-19 DIAGNOSIS — E785 Hyperlipidemia, unspecified: Secondary | ICD-10-CM | POA: Diagnosis not present

## 2023-04-13 ENCOUNTER — Ambulatory Visit
Admission: RE | Admit: 2023-04-13 | Discharge: 2023-04-13 | Disposition: A | Discharge status: Home / Self Care | Payer: Medicare Other | Source: Ambulatory Visit | Attending: Internal Medicine | Admitting: Internal Medicine

## 2023-04-13 ENCOUNTER — Other Ambulatory Visit: Payer: Self-pay | Admitting: Internal Medicine

## 2023-04-13 DIAGNOSIS — E785 Hyperlipidemia, unspecified: Secondary | ICD-10-CM | POA: Diagnosis not present

## 2023-04-13 DIAGNOSIS — I10 Essential (primary) hypertension: Secondary | ICD-10-CM | POA: Diagnosis not present

## 2023-04-13 DIAGNOSIS — Z23 Encounter for immunization: Secondary | ICD-10-CM | POA: Diagnosis not present

## 2023-04-13 DIAGNOSIS — G8929 Other chronic pain: Secondary | ICD-10-CM

## 2023-04-13 DIAGNOSIS — M25569 Pain in unspecified knee: Secondary | ICD-10-CM | POA: Diagnosis not present

## 2023-04-13 DIAGNOSIS — M25561 Pain in right knee: Secondary | ICD-10-CM | POA: Diagnosis not present

## 2023-04-13 DIAGNOSIS — M25461 Effusion, right knee: Secondary | ICD-10-CM | POA: Diagnosis not present

## 2023-04-13 DIAGNOSIS — M25462 Effusion, left knee: Secondary | ICD-10-CM | POA: Diagnosis not present

## 2023-04-13 DIAGNOSIS — Z85118 Personal history of other malignant neoplasm of bronchus and lung: Secondary | ICD-10-CM | POA: Diagnosis not present

## 2023-04-13 DIAGNOSIS — M25562 Pain in left knee: Secondary | ICD-10-CM | POA: Diagnosis not present

## 2023-04-13 DIAGNOSIS — I2510 Atherosclerotic heart disease of native coronary artery without angina pectoris: Secondary | ICD-10-CM | POA: Diagnosis not present

## 2023-05-11 DIAGNOSIS — I2510 Atherosclerotic heart disease of native coronary artery without angina pectoris: Secondary | ICD-10-CM | POA: Diagnosis not present

## 2023-05-11 DIAGNOSIS — I10 Essential (primary) hypertension: Secondary | ICD-10-CM | POA: Diagnosis not present

## 2023-05-11 DIAGNOSIS — J410 Simple chronic bronchitis: Secondary | ICD-10-CM | POA: Diagnosis not present

## 2023-05-13 DIAGNOSIS — M170 Bilateral primary osteoarthritis of knee: Secondary | ICD-10-CM | POA: Diagnosis not present

## 2023-06-10 DIAGNOSIS — I2510 Atherosclerotic heart disease of native coronary artery without angina pectoris: Secondary | ICD-10-CM | POA: Diagnosis not present

## 2023-10-20 DIAGNOSIS — E039 Hypothyroidism, unspecified: Secondary | ICD-10-CM | POA: Diagnosis not present

## 2023-10-20 DIAGNOSIS — E785 Hyperlipidemia, unspecified: Secondary | ICD-10-CM | POA: Diagnosis not present

## 2023-10-20 DIAGNOSIS — G4700 Insomnia, unspecified: Secondary | ICD-10-CM | POA: Diagnosis not present

## 2023-10-20 DIAGNOSIS — M170 Bilateral primary osteoarthritis of knee: Secondary | ICD-10-CM | POA: Diagnosis not present

## 2023-10-20 DIAGNOSIS — I739 Peripheral vascular disease, unspecified: Secondary | ICD-10-CM | POA: Diagnosis not present

## 2023-10-20 DIAGNOSIS — Z0000 Encounter for general adult medical examination without abnormal findings: Secondary | ICD-10-CM | POA: Diagnosis not present

## 2023-10-20 DIAGNOSIS — Z85118 Personal history of other malignant neoplasm of bronchus and lung: Secondary | ICD-10-CM | POA: Diagnosis not present

## 2023-10-20 DIAGNOSIS — I10 Essential (primary) hypertension: Secondary | ICD-10-CM | POA: Diagnosis not present

## 2023-10-20 DIAGNOSIS — I2510 Atherosclerotic heart disease of native coronary artery without angina pectoris: Secondary | ICD-10-CM | POA: Diagnosis not present

## 2023-11-20 DIAGNOSIS — E039 Hypothyroidism, unspecified: Secondary | ICD-10-CM | POA: Diagnosis not present

## 2023-11-20 DIAGNOSIS — N1831 Chronic kidney disease, stage 3a: Secondary | ICD-10-CM | POA: Diagnosis not present

## 2023-11-20 DIAGNOSIS — Z85118 Personal history of other malignant neoplasm of bronchus and lung: Secondary | ICD-10-CM | POA: Diagnosis not present

## 2023-11-22 DIAGNOSIS — E785 Hyperlipidemia, unspecified: Secondary | ICD-10-CM | POA: Diagnosis not present

## 2023-12-23 ENCOUNTER — Other Ambulatory Visit (HOSPITAL_COMMUNITY): Payer: Self-pay | Admitting: Internal Medicine

## 2023-12-23 DIAGNOSIS — H5462 Unqualified visual loss, left eye, normal vision right eye: Secondary | ICD-10-CM

## 2023-12-23 DIAGNOSIS — E785 Hyperlipidemia, unspecified: Secondary | ICD-10-CM | POA: Diagnosis not present

## 2023-12-23 DIAGNOSIS — R519 Headache, unspecified: Secondary | ICD-10-CM | POA: Diagnosis not present

## 2023-12-23 DIAGNOSIS — G4700 Insomnia, unspecified: Secondary | ICD-10-CM | POA: Diagnosis not present

## 2023-12-23 DIAGNOSIS — I10 Essential (primary) hypertension: Secondary | ICD-10-CM | POA: Diagnosis not present

## 2023-12-26 ENCOUNTER — Ambulatory Visit (HOSPITAL_BASED_OUTPATIENT_CLINIC_OR_DEPARTMENT_OTHER)
Admission: RE | Admit: 2023-12-26 | Discharge: 2023-12-26 | Disposition: A | Discharge status: Home / Self Care | Source: Ambulatory Visit | Attending: Internal Medicine | Admitting: Internal Medicine

## 2023-12-26 DIAGNOSIS — I739 Peripheral vascular disease, unspecified: Secondary | ICD-10-CM | POA: Insufficient documentation

## 2023-12-26 DIAGNOSIS — R519 Headache, unspecified: Secondary | ICD-10-CM | POA: Diagnosis not present

## 2023-12-26 DIAGNOSIS — H5462 Unqualified visual loss, left eye, normal vision right eye: Secondary | ICD-10-CM | POA: Insufficient documentation

## 2023-12-26 DIAGNOSIS — I6523 Occlusion and stenosis of bilateral carotid arteries: Secondary | ICD-10-CM | POA: Diagnosis not present

## 2023-12-28 ENCOUNTER — Ambulatory Visit (HOSPITAL_COMMUNITY)
Admission: RE | Admit: 2023-12-28 | Discharge: 2023-12-28 | Disposition: A | Discharge status: Home / Self Care | Source: Ambulatory Visit | Attending: Vascular Surgery | Admitting: Vascular Surgery

## 2023-12-28 ENCOUNTER — Other Ambulatory Visit (HOSPITAL_COMMUNITY): Payer: Self-pay | Admitting: Internal Medicine

## 2023-12-28 DIAGNOSIS — H5462 Unqualified visual loss, left eye, normal vision right eye: Secondary | ICD-10-CM

## 2024-01-20 DIAGNOSIS — R3912 Poor urinary stream: Secondary | ICD-10-CM | POA: Diagnosis not present

## 2024-01-20 DIAGNOSIS — Z85118 Personal history of other malignant neoplasm of bronchus and lung: Secondary | ICD-10-CM | POA: Diagnosis not present

## 2024-01-20 DIAGNOSIS — N1831 Chronic kidney disease, stage 3a: Secondary | ICD-10-CM | POA: Diagnosis not present

## 2024-01-20 DIAGNOSIS — E039 Hypothyroidism, unspecified: Secondary | ICD-10-CM | POA: Diagnosis not present

## 2024-01-28 DIAGNOSIS — I10 Essential (primary) hypertension: Secondary | ICD-10-CM | POA: Diagnosis not present

## 2024-01-28 DIAGNOSIS — I739 Peripheral vascular disease, unspecified: Secondary | ICD-10-CM | POA: Diagnosis not present

## 2024-01-28 DIAGNOSIS — I160 Hypertensive urgency: Secondary | ICD-10-CM | POA: Diagnosis not present

## 2024-01-28 DIAGNOSIS — E785 Hyperlipidemia, unspecified: Secondary | ICD-10-CM | POA: Diagnosis not present

## 2024-02-20 DIAGNOSIS — N1831 Chronic kidney disease, stage 3a: Secondary | ICD-10-CM | POA: Diagnosis not present

## 2024-02-20 DIAGNOSIS — E039 Hypothyroidism, unspecified: Secondary | ICD-10-CM | POA: Diagnosis not present

## 2024-02-20 DIAGNOSIS — Z85118 Personal history of other malignant neoplasm of bronchus and lung: Secondary | ICD-10-CM | POA: Diagnosis not present

## 2024-03-21 DIAGNOSIS — N1831 Chronic kidney disease, stage 3a: Secondary | ICD-10-CM | POA: Diagnosis not present

## 2024-03-21 DIAGNOSIS — E039 Hypothyroidism, unspecified: Secondary | ICD-10-CM | POA: Diagnosis not present

## 2024-03-21 DIAGNOSIS — Z85118 Personal history of other malignant neoplasm of bronchus and lung: Secondary | ICD-10-CM | POA: Diagnosis not present

## 2024-03-31 DIAGNOSIS — I10 Essential (primary) hypertension: Secondary | ICD-10-CM | POA: Diagnosis not present

## 2024-07-26 ENCOUNTER — Other Ambulatory Visit (HOSPITAL_COMMUNITY): Payer: Self-pay | Admitting: Internal Medicine

## 2024-07-26 DIAGNOSIS — R058 Other specified cough: Secondary | ICD-10-CM

## 2024-07-27 ENCOUNTER — Ambulatory Visit (HOSPITAL_COMMUNITY): Admission: RE | Admit: 2024-07-27 | Discharge: 2024-07-27 | Attending: Internal Medicine | Admitting: Internal Medicine

## 2024-07-27 DIAGNOSIS — R058 Other specified cough: Secondary | ICD-10-CM

## 2024-07-27 MED ORDER — IOHEXOL 300 MG/ML  SOLN
75.0000 mL | Freq: Once | INTRAMUSCULAR | Status: AC | PRN
  Administered 2024-07-27: 75 mL via INTRAVENOUS
